# Patient Record
Sex: Female | Born: 1935 | Race: White | Hispanic: No | Marital: Married | Smoking: Current some day smoker
Health system: Southern US, Community
[De-identification: ages and names within clinical notes are randomized; demographics above are authoritative.]

## PROBLEM LIST (undated history)

## (undated) DIAGNOSIS — I10 Essential (primary) hypertension: Secondary | ICD-10-CM

## (undated) DIAGNOSIS — E785 Hyperlipidemia, unspecified: Secondary | ICD-10-CM

## (undated) DIAGNOSIS — C801 Malignant (primary) neoplasm, unspecified: Secondary | ICD-10-CM

## (undated) DIAGNOSIS — I716 Thoracoabdominal aortic aneurysm, without rupture, unspecified: Secondary | ICD-10-CM

## (undated) DIAGNOSIS — I209 Angina pectoris, unspecified: Secondary | ICD-10-CM

## (undated) DIAGNOSIS — J449 Chronic obstructive pulmonary disease, unspecified: Secondary | ICD-10-CM

## (undated) DIAGNOSIS — S7290XA Unspecified fracture of unspecified femur, initial encounter for closed fracture: Secondary | ICD-10-CM

## (undated) DIAGNOSIS — I6529 Occlusion and stenosis of unspecified carotid artery: Secondary | ICD-10-CM

## (undated) HISTORY — PX: FOOT FRACTURE SURGERY: SHX645

## (undated) HISTORY — PX: CAROTID ENDARTERECTOMY: SUR193

## (undated) HISTORY — PX: TONSILLECTOMY: SUR1361

## (undated) HISTORY — PX: CATARACT EXTRACTION: SUR2

---

## 2014-02-01 DIAGNOSIS — S7290XA Unspecified fracture of unspecified femur, initial encounter for closed fracture: Secondary | ICD-10-CM

## 2014-02-01 HISTORY — DX: Unspecified fracture of unspecified femur, initial encounter for closed fracture: S72.90XA

## 2014-02-26 ENCOUNTER — Inpatient Hospital Stay (HOSPITAL_COMMUNITY): Payer: Medicare Other

## 2014-02-26 ENCOUNTER — Encounter (HOSPITAL_COMMUNITY): Payer: Self-pay | Admitting: General Practice

## 2014-02-26 ENCOUNTER — Inpatient Hospital Stay (HOSPITAL_COMMUNITY)
Admission: AD | Admit: 2014-02-26 | Discharge: 2014-03-03 | DRG: 536 | Disposition: A | Discharge status: Skilled Nursing Facility | Payer: Medicare Other | Source: Other Acute Inpatient Hospital | Attending: Internal Medicine | Admitting: Internal Medicine

## 2014-02-26 DIAGNOSIS — Z7401 Bed confinement status: Secondary | ICD-10-CM

## 2014-02-26 DIAGNOSIS — E039 Hypothyroidism, unspecified: Secondary | ICD-10-CM | POA: Diagnosis present

## 2014-02-26 DIAGNOSIS — I5032 Chronic diastolic (congestive) heart failure: Secondary | ICD-10-CM | POA: Diagnosis present

## 2014-02-26 DIAGNOSIS — F0390 Unspecified dementia without behavioral disturbance: Secondary | ICD-10-CM | POA: Diagnosis present

## 2014-02-26 DIAGNOSIS — E785 Hyperlipidemia, unspecified: Secondary | ICD-10-CM | POA: Diagnosis present

## 2014-02-26 DIAGNOSIS — I10 Essential (primary) hypertension: Secondary | ICD-10-CM | POA: Diagnosis present

## 2014-02-26 DIAGNOSIS — S72009A Fracture of unspecified part of neck of unspecified femur, initial encounter for closed fracture: Secondary | ICD-10-CM

## 2014-02-26 DIAGNOSIS — I714 Abdominal aortic aneurysm, without rupture, unspecified: Secondary | ICD-10-CM

## 2014-02-26 DIAGNOSIS — I509 Heart failure, unspecified: Secondary | ICD-10-CM | POA: Diagnosis present

## 2014-02-26 DIAGNOSIS — J479 Bronchiectasis, uncomplicated: Secondary | ICD-10-CM | POA: Diagnosis present

## 2014-02-26 DIAGNOSIS — S72033A Displaced midcervical fracture of unspecified femur, initial encounter for closed fracture: Principal | ICD-10-CM | POA: Diagnosis present

## 2014-02-26 DIAGNOSIS — M79609 Pain in unspecified limb: Secondary | ICD-10-CM

## 2014-02-26 DIAGNOSIS — F17200 Nicotine dependence, unspecified, uncomplicated: Secondary | ICD-10-CM | POA: Diagnosis present

## 2014-02-26 DIAGNOSIS — I25810 Atherosclerosis of coronary artery bypass graft(s) without angina pectoris: Secondary | ICD-10-CM | POA: Diagnosis present

## 2014-02-26 DIAGNOSIS — I6529 Occlusion and stenosis of unspecified carotid artery: Secondary | ICD-10-CM | POA: Diagnosis present

## 2014-02-26 DIAGNOSIS — Y92009 Unspecified place in unspecified non-institutional (private) residence as the place of occurrence of the external cause: Secondary | ICD-10-CM

## 2014-02-26 DIAGNOSIS — Z85820 Personal history of malignant melanoma of skin: Secondary | ICD-10-CM

## 2014-02-26 DIAGNOSIS — S72001A Fracture of unspecified part of neck of right femur, initial encounter for closed fracture: Secondary | ICD-10-CM | POA: Diagnosis present

## 2014-02-26 DIAGNOSIS — I209 Angina pectoris, unspecified: Secondary | ICD-10-CM | POA: Diagnosis present

## 2014-02-26 DIAGNOSIS — J438 Other emphysema: Secondary | ICD-10-CM | POA: Diagnosis present

## 2014-02-26 DIAGNOSIS — W19XXXA Unspecified fall, initial encounter: Secondary | ICD-10-CM | POA: Diagnosis present

## 2014-02-26 HISTORY — DX: Angina pectoris, unspecified: I20.9

## 2014-02-26 HISTORY — DX: Hyperlipidemia, unspecified: E78.5

## 2014-02-26 HISTORY — DX: Thoracoabdominal aortic aneurysm, without rupture, unspecified: I71.60

## 2014-02-26 HISTORY — DX: Malignant (primary) neoplasm, unspecified: C80.1

## 2014-02-26 HISTORY — DX: Chronic obstructive pulmonary disease, unspecified: J44.9

## 2014-02-26 HISTORY — DX: Unspecified fracture of unspecified femur, initial encounter for closed fracture: S72.90XA

## 2014-02-26 HISTORY — DX: Essential (primary) hypertension: I10

## 2014-02-26 HISTORY — DX: Occlusion and stenosis of unspecified carotid artery: I65.29

## 2014-02-26 HISTORY — DX: Thoracoabdominal aortic aneurysm, without rupture: I71.6

## 2014-02-26 LAB — URINALYSIS, ROUTINE W REFLEX MICROSCOPIC
BILIRUBIN URINE: NEGATIVE
GLUCOSE, UA: NEGATIVE mg/dL
KETONES UR: NEGATIVE mg/dL
Leukocytes, UA: NEGATIVE
Nitrite: NEGATIVE
Protein, ur: NEGATIVE mg/dL
Specific Gravity, Urine: 1.021 (ref 1.005–1.030)
Urobilinogen, UA: 1 mg/dL (ref 0.0–1.0)
pH: 7 (ref 5.0–8.0)

## 2014-02-26 LAB — COMPREHENSIVE METABOLIC PANEL
ALBUMIN: 2.9 g/dL — AB (ref 3.5–5.2)
ALK PHOS: 82 U/L (ref 39–117)
ALT: 34 U/L (ref 0–35)
AST: 33 U/L (ref 0–37)
BILIRUBIN TOTAL: 0.3 mg/dL (ref 0.3–1.2)
BUN: 17 mg/dL (ref 6–23)
CHLORIDE: 99 meq/L (ref 96–112)
CO2: 30 mEq/L (ref 19–32)
Calcium: 9.2 mg/dL (ref 8.4–10.5)
Creatinine, Ser: 0.8 mg/dL (ref 0.50–1.10)
GFR calc Af Amer: 76 mL/min — ABNORMAL LOW (ref >90)
GFR calc non Af Amer: 65 mL/min — ABNORMAL LOW (ref >90)
GLUCOSE: 131 mg/dL — AB (ref 70–99)
POTASSIUM: 4.2 meq/L (ref 3.7–5.3)
SODIUM: 140 meq/L (ref 137–147)
Total Protein: 6.5 g/dL (ref 6.0–8.3)

## 2014-02-26 LAB — LIPID PANEL
CHOLESTEROL: 148 mg/dL (ref 0–200)
HDL: 62 mg/dL (ref >39)
LDL Cholesterol: 74 mg/dL (ref 0–99)
Total CHOL/HDL Ratio: 2.4 RATIO
Triglycerides: 60 mg/dL (ref <150)
VLDL: 12 mg/dL (ref 0–40)

## 2014-02-26 LAB — CBC WITH DIFFERENTIAL/PLATELET
BASOS ABS: 0.1 10*3/uL (ref 0.0–0.1)
Basophils Relative: 1 % (ref 0–1)
EOS PCT: 4 % (ref 0–5)
Eosinophils Absolute: 0.3 10*3/uL (ref 0.0–0.7)
HEMATOCRIT: 40.9 % (ref 36.0–46.0)
Hemoglobin: 13.7 g/dL (ref 12.0–15.0)
Lymphocytes Relative: 17 % (ref 12–46)
Lymphs Abs: 1.5 10*3/uL (ref 0.7–4.0)
MCH: 30.3 pg (ref 26.0–34.0)
MCHC: 33.5 g/dL (ref 30.0–36.0)
MCV: 90.5 fL (ref 78.0–100.0)
Monocytes Absolute: 0.9 10*3/uL (ref 0.1–1.0)
Monocytes Relative: 11 % (ref 3–12)
NEUTROS ABS: 5.8 10*3/uL (ref 1.7–7.7)
Neutrophils Relative %: 68 % (ref 43–77)
Platelets: 314 10*3/uL (ref 150–400)
RBC: 4.52 MIL/uL (ref 3.87–5.11)
RDW: 15 % (ref 11.5–15.5)
WBC: 8.5 10*3/uL (ref 4.0–10.5)

## 2014-02-26 LAB — URINE MICROSCOPIC-ADD ON

## 2014-02-26 LAB — MAGNESIUM: MAGNESIUM: 2.2 mg/dL (ref 1.5–2.5)

## 2014-02-26 LAB — TROPONIN I: Troponin I: <0.3 ng/mL (ref <0.30)

## 2014-02-26 MED ORDER — ASPIRIN EC 81 MG PO TBEC
81.0000 mg | DELAYED_RELEASE_TABLET | Freq: Every day | ORAL | Status: DC
  Administered 2014-02-26 – 2014-03-03 (×6): 81 mg via ORAL
  Filled 2014-02-26 (×6): qty 1

## 2014-02-26 MED ORDER — DILTIAZEM HCL ER COATED BEADS 240 MG PO CP24
240.0000 mg | ORAL_CAPSULE | Freq: Every day | ORAL | Status: DC
  Administered 2014-02-27 – 2014-03-03 (×5): 240 mg via ORAL
  Filled 2014-02-26 (×5): qty 1

## 2014-02-26 MED ORDER — LEVALBUTEROL HCL 1.25 MG/0.5ML IN NEBU
1.2500 mg | INHALATION_SOLUTION | Freq: Four times a day (QID) | RESPIRATORY_TRACT | Status: DC | PRN
  Filled 2014-02-26: qty 0.5

## 2014-02-26 MED ORDER — ATORVASTATIN CALCIUM 10 MG PO TABS
10.0000 mg | ORAL_TABLET | Freq: Every day | ORAL | Status: DC
  Administered 2014-02-27 – 2014-03-02 (×4): 10 mg via ORAL
  Filled 2014-02-26 (×5): qty 1

## 2014-02-26 MED ORDER — TIOTROPIUM BROMIDE MONOHYDRATE 18 MCG IN CAPS
18.0000 ug | ORAL_CAPSULE | Freq: Every day | RESPIRATORY_TRACT | Status: DC
  Administered 2014-02-28 – 2014-03-03 (×4): 18 ug via RESPIRATORY_TRACT
  Filled 2014-02-26: qty 5

## 2014-02-26 MED ORDER — SENNA 8.6 MG PO TABS
1.0000 | ORAL_TABLET | Freq: Two times a day (BID) | ORAL | Status: DC
  Administered 2014-02-26 – 2014-03-03 (×4): 8.6 mg via ORAL
  Filled 2014-02-26 (×11): qty 1

## 2014-02-26 MED ORDER — NICOTINE 21 MG/24HR TD PT24
21.0000 mg | MEDICATED_PATCH | Freq: Every day | TRANSDERMAL | Status: DC
  Administered 2014-02-26: 21 mg via TRANSDERMAL
  Filled 2014-02-26 (×2): qty 1

## 2014-02-26 MED ORDER — CEFAZOLIN SODIUM 1-5 GM-% IV SOLN
1.0000 g | Freq: Once | INTRAVENOUS | Status: AC
  Administered 2014-02-27: 1 g via INTRAVENOUS
  Filled 2014-02-26: qty 50

## 2014-02-26 MED ORDER — OXYCODONE HCL 5 MG PO TABS
5.0000 mg | ORAL_TABLET | ORAL | Status: DC | PRN
  Administered 2014-02-28 – 2014-03-03 (×9): 5 mg via ORAL
  Filled 2014-02-26 (×9): qty 1

## 2014-02-26 MED ORDER — ACETAMINOPHEN 325 MG PO TABS
650.0000 mg | ORAL_TABLET | Freq: Four times a day (QID) | ORAL | Status: DC | PRN
  Administered 2014-02-28 – 2014-03-01 (×2): 650 mg via ORAL
  Filled 2014-02-26 (×4): qty 2

## 2014-02-26 MED ORDER — ACETAMINOPHEN 650 MG RE SUPP: 650.0000 mg | Freq: Four times a day (QID) | RECTAL | Status: DC | PRN

## 2014-02-26 MED ORDER — ENOXAPARIN SODIUM 40 MG/0.4ML ~~LOC~~ SOLN
40.0000 mg | SUBCUTANEOUS | Status: DC
  Administered 2014-02-26 – 2014-03-02 (×5): 40 mg via SUBCUTANEOUS
  Filled 2014-02-26 (×6): qty 0.4

## 2014-02-26 MED ORDER — EZETIMIBE 10 MG PO TABS
5.0000 mg | ORAL_TABLET | Freq: Every day | ORAL | Status: DC
  Administered 2014-02-27 – 2014-03-03 (×5): 5 mg via ORAL
  Filled 2014-02-26 (×5): qty 0.5

## 2014-02-26 MED ORDER — SODIUM CHLORIDE 0.9 % IV SOLN
INTRAVENOUS | Status: DC
  Administered 2014-02-26: 21:00:00 via INTRAVENOUS

## 2014-02-26 MED ORDER — DOCUSATE SODIUM 100 MG PO CAPS
100.0000 mg | ORAL_CAPSULE | Freq: Two times a day (BID) | ORAL | Status: DC
  Administered 2014-02-26 – 2014-03-03 (×5): 100 mg via ORAL
  Filled 2014-02-26 (×11): qty 1

## 2014-02-26 NOTE — Progress Notes (Signed)
Patient has arrived via transport from [place] to 2w20. VS stable, patient assessed.  Attending MD made aware.
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
## 2014-02-26 NOTE — Progress Notes (Signed)
Patient arrived from hospital in [place] with 16 FR foley. Insertion date unknown at this time. No insertion date listed on bad.  Will investigate in paper chart. Elmarie Shiley R
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
## 2014-02-26 NOTE — H&P (Addendum)
[facility] History and Physical  Amy Bender TIW:[id number] DOB: Jul 16, 1926 DOA: 02/26/2014  Referring physician:  PCP: PROVIDER NOT IN SYSTEM  Specialists:   Chief Complaint:   HPI: Amy Bender  63 y WF  PMHx diastolic CHF, dementia, AAA 6.8-6.9 cm by CT at [facility] 09/18/2013, angina, HLD, HTN, CAD, COPD (diffuse emphysema/bronchiectasis) diagnosed by CT scan 09/18/2013. Perforation approximately one month ago slipped while trying to open a refrigerator door fell to the floor onto her right side. States for approximately the last month has been between home and [facility] in [place]. Patient was also seen by Dr. Corky Sox at [facility] (vascular surgery) and appears offered patient surgical repair of AAA however within notes states that family decided against repair (patient states has no family).Patient states fell approximately one month ago patient presents with right femoral neck fracture which occurred approximately a month ago per patient. Dr. Bridgett Larsson (vascular surgery)is evaluating patient secondary to her large AAA, PA Lanny Hurst Paul/Dr. Marcelino Scot (orthopedic surgery)is evaluating patient for surgical repair vs conservative management.patient currently complains of no pain, although states really need to cigarette.     Review of Systems: The patient denies anorexia, fever, weight loss,, vision loss, decreased hearing, hoarseness, syncope, peripheral edema, hemoptysis, abdominal pain, melena, hematochezia, severe indigestion/heartburn, hematuria, incontinence, genital sores, muscle weakness, suspicious skin lesions, transient blindness, depression, unusual weight change, abnormal bleeding, enlarged lymph nodes, angioedema, and breast masses.    TRAVEL HISTORY:   Procedure Echocardiogram at [facility] 02/16/2014 -Moderate LVH -LVEF= 25% -Grade 1 diastolic dysfunction -Borderline pulmonary hypertension   Antibiotics   Consultation Dr. Bridgett Larsson  (vascular surgery) PA Lanny Hurst Paul(orthopedic surgery)    Past Medical History  Diagnosis Date  . Anginal pain    No past surgical history on file. Social History:  1 PPD x60 years, positive ETOHglass of wine nightly, negative drugs where does patient live--home, ALF, SNF? Home with 2 borders  Can patient participate in ADLs?  No Known Allergies  No family history on file.   Prior to Admission medications   Not on File   Physical Exam: Filed Vitals:   02/26/14 1741  BP: 105/81  Pulse: 76  Temp: 98.3 F (36.8 C)  TempSrc: Oral  Resp: 20  Height: 5\' 6"  (1.676 m)  Weight: 57.657 kg (127 lb 1.8 oz)  SpO2: 97%     General:  A./O. x3 (unsure of when), patient tends to wander off subject, NAD  Eyes: pupils equal reactive to light and accommodation  Neck: negative JVD, negative lymphadenopathy  Cardiovascular: regular rate, negative murmurs rubs gallops  Respiratory: clear to auscultation bilateral  Abdomen: soft, nontender, nondistended, plus bowel sounds  Musculoskeletal: right leg sits in anatomically correct position,DP/PT pulse bilateral lower extremities 1+  Neurologic: cranial nerve II through XII intact, patient pleasantly mildly demented, follows all commands moves all extremities to command, sensation intact throughout  Labs on Admission:  Basic Metabolic Panel: No results found for this basename: NA, K, CL, CO2, GLUCOSE, BUN, CREATININE, CALCIUM, MG, PHOS,  in the last 168 hours Liver Function Tests: No results found for this basename: AST, ALT, ALKPHOS, BILITOT, PROT, ALBUMIN,  in the last 168 hours No results found for this basename: LIPASE, AMYLASE,  in the last 168 hours No results found for this basename: AMMONIA,  in the last 168 hours CBC: No results found for this basename: WBC, NEUTROABS, HGB, HCT, MCV, PLT,  in the last 168 hours Cardiac Enzymes: No results found  for this basename: CKTOTAL, CKMB, CKMBINDEX, TROPONINI,  in the last 168  hours  BNP (last 3 results) No results found for this basename: PROBNP,  in the last 8760 hours CBG: No results found for this basename: GLUCAP,  in the last 168 hours  Radiological Exams on Admission: No results found.  EKG: pending  Assessment/Plan Principal Problem:   Fracture of neck of right femur Active Problems:   AAA (abdominal aortic aneurysm) without rupture   HLD (hyperlipidemia)   HTN (hypertension)   CAD (coronary artery disease) of artery bypass graft   Other emphysema   Bronchiectasis without acute exacerbation   Anginal pain   Diastolic CHF, chronic    Right femur neck fracture  -PA Lanny Hurst Paul/Dr. Marcelino Scot (orthopedic surgery)will determine if fracture repairable after obtaining new x-ray studies.  AAA -Dr. Bridgett Larsson (vascular surgery)will obtain new CT angiogram to determine if AAA has enlarged, is repairable, or it is safe for orthopedic surgery to proceed if required.  Diastolic CHF -Diltiazem ER 240 mg daily -obtain EKG -Obtain troponin x3 -Obtain proBNP -Will consider updated echocardiogram dependent on findings  Anginal pain -See diastolic CHF -HEART Score; ? But will be very high  HLD -Lipitor 10 mg daily -Obtain lipid panel-  COPD -Respiratory to titrate O2 to maintain SpO2> 90% -Xopenex q 6 hour PRN SOB -Spiriva daily  Bronchiectasis -See COPD  Nicotine dependence -Start patient on nicotine patch       Code Status: full Family Communication: healthcare power of attorney is Amy Bender  [phone] See notarized documented in chart. This polk did not accompany patient to admission Disposition Plan: per orthopedic surgery  Time spent: 90 minutes  Allie Bossier [facility] Pager [phone]  If 7PM-7AM, please contact night-coverage [email or web address] Password [facility] 02/26/2014, 7:47 PM

## 2014-02-27 ENCOUNTER — Encounter (HOSPITAL_COMMUNITY): Payer: Self-pay | Admitting: Radiology

## 2014-02-27 ENCOUNTER — Inpatient Hospital Stay (HOSPITAL_COMMUNITY): Payer: Medicare Other

## 2014-02-27 DIAGNOSIS — M79609 Pain in unspecified limb: Secondary | ICD-10-CM

## 2014-02-27 LAB — TROPONIN I
Troponin I: <0.3 ng/mL (ref <0.30)
Troponin I: <0.3 ng/mL (ref <0.30)

## 2014-02-27 LAB — CBC
HEMATOCRIT: 41 % (ref 36.0–46.0)
HEMOGLOBIN: 13.8 g/dL (ref 12.0–15.0)
MCH: 30.4 pg (ref 26.0–34.0)
MCHC: 33.7 g/dL (ref 30.0–36.0)
MCV: 90.3 fL (ref 78.0–100.0)
Platelets: 319 10*3/uL (ref 150–400)
RBC: 4.54 MIL/uL (ref 3.87–5.11)
RDW: 15 % (ref 11.5–15.5)
WBC: 9 10*3/uL (ref 4.0–10.5)

## 2014-02-27 LAB — TSH: TSH: 5.019 u[IU]/mL — ABNORMAL HIGH (ref 0.350–4.500)

## 2014-02-27 LAB — HEMOGLOBIN A1C
Hgb A1c MFr Bld: 5.6 % (ref <5.7)
MEAN PLASMA GLUCOSE: 114 mg/dL (ref <117)

## 2014-02-27 MED ORDER — IOHEXOL 350 MG/ML SOLN
100.0000 mL | Freq: Once | INTRAVENOUS | Status: AC | PRN
  Administered 2014-02-27: 100 mL via INTRAVENOUS

## 2014-02-27 NOTE — Progress Notes (Signed)
Orthopaedic Trauma Service Progress Note  Subjective  Doing well this am Pain controlled Sitting up in bed Cousin visiting   Objective   BP 119/74  Pulse 98  Temp(Src) 98.4 F (36.9 C) (Oral)  Resp 18  Ht 5\' 6"  (1.676 m)  Wt 57.657 kg (127 lb 1.8 oz)  BMI 20.53 kg/m2  SpO2 99%  Intake/Output     03/26 0701 - 03/27 0700 03/27 0701 - 03/28 0700   I.V. (mL/kg)  700 (12.1)   Total Intake(mL/kg)  700 (12.1)   Urine (mL/kg/hr)  1000 (3.6)   Total Output   1000   Net   -300           Exam   Gen: awake and alert, appears comfortable  Ext:        Right Lower Extremity   No interval change from exam yesterday evening    Imaging  CT abd/pelvis, Xray R hip   After review of CT and xray from a bone standpoint the R femoral neck fracture appears to be stable and shows some evidence of healing   Assessment and Plan   POD/HD#: 1 (possibly 4 weeks post injury, at least 2)   80 y/o female s/p fall approximately 1 month ago with R femoral neck fracture and AAA, as well as numerous other medical comorbities   1. Subacute R subcapital valgus impacted R femoral neck fracture             stable on follow up films  Will manage non-op for now and make pt/fracture prove that she needs surgery  Certainly vascular issues could decrease healing capacity as well, certainly nicotine use will do this as well  PT/OT evals  TDWB with walker  Would like PT to work with pt sat and sun   2. AAA             Per vascular               Please see note by Dr. Bridgett Larsson for for evaluation and recommendations  3. Medical issues             Per primary team  4. FEN             advance as tolerated   5. DVT/PE prophylaxis               SCD  Ok for pharmacologics from our standpoint  6. UTI        U/A was negative on admission labs  Adequately treated at other facility   7. Pain               Tylenol and low dose narcotics as needed  8. Nicotine dependence  No nicotine patch  This a  continuous source of nicotine with increases the pts risk of healing complication by approximately 40%   9. Dispo             non-op             Will need snf at dc               has supportive extended family as one cousin lives in [place] and one lives in [place] situation sound questionable, 2 males live in her house and are partners, doesn't sound like they were necessarily invited to live there but moved in [place]. ? If this needs to be explored to protect pt.    [name], PA-C [facility] [phone] (P) 02/27/2014 11:52 AM

## 2014-02-27 NOTE — Progress Notes (Addendum)
   Daily Progress Note  Assessment/Planning: AAA, R femoral neck fracture   Briefly on my review of this patient's CTA, it appears her AAA is marginally larger with a orthogonal diameter slightly >7.0 cm.  Her anatomy is not favorable due to extensive calcification of the distal aorta and iliac arteries.  The right common iliac artery appears occluded and the common femoral arteries appears severely calcified.    Her anatomic issues prevent her from getting an endograft repair.  These same issues would lengthen an open repair.  With her age and co-morbidities, she is at increased risk for operative mortality, likely >10%.  I have discussed briefly the options with this patient: operative repair with Dr. Sammuel Hines vs. expectant management.  At this point, she remains asx so no intervention is immediately needed.  If pt elects to proceed with repair, I would transfer her to [facility].  I have discussed this case with one of my partners, Dr. Oneida Alar, who agrees this patient is best served in Dr. Eddie Dibbles care.  Subjective   Various complaints  Objective Filed Vitals:   02/26/14 1741 02/26/14 2023 02/27/14 0544  BP: 105/81 120/93 119/74  Pulse: 76 75 98  Temp: 98.3 F (36.8 C) 98.6 F (37 C) 98.4 F (36.9 C)  TempSrc: Oral Oral Oral  Resp: 20 17 18   Height: 5\' 6"  (1.676 m)    Weight: 127 lb 1.8 oz (57.657 kg)    SpO2: 97% 95% 99%   No intake or output data in the 24 hours ending 02/27/14 0730  PULM  CTAB CV  RRR GI  soft, NTND VASC  No palpable pedal pulses, palpable pulsatile mass in abdomen  Laboratory CBC    Component Value Date/Time   WBC 9.0 02/27/2014 0130   HGB 13.8 02/27/2014 0130   HCT 41.0 02/27/2014 0130   PLT 319 02/27/2014 0130    BMET    Component Value Date/Time   NA 140 02/26/2014 2218   K 4.2 02/26/2014 2218   CL 99 02/26/2014 2218   CO2 30 02/26/2014 2218   GLUCOSE 131* 02/26/2014 2218   BUN 17 02/26/2014 2218   CREATININE 0.80 02/26/2014 2218   CALCIUM 9.2 02/26/2014 2218   GFRNONAA 65* 02/26/2014 2218   GFRAA 76* 02/26/2014 2218    Adele Barthel, MD [facility] [place]: [phone] Pager: [phone]  02/27/2014, 7:30 AM

## 2014-02-27 NOTE — Progress Notes (Addendum)
[facility] PROGRESS NOTE  Amy Bender HDQ:[id number] DOB: 1926/04/29 DOA: 02/26/2014 PCP: PASI, MOHIT, MD  Brief narrative: 80 year-old female with past medical history of diastolic CHF, dementia, AAA 6.8 - 6.9 cm on CT 09/18/2013, dyslipidemia, hypertension, CAD, COPD with diffuse emphysema who presented to [facility] ED 02/26/2014 status post fall 4 weeks prior to this admission. Right hip x-ray on admission showed right femoral neck fracture. Also seen was a large abdominal aortic aneurysm, 9.3 cm. This was confirmed on CT angio abdomen, diameter of the aneurysm was however 8 cm.  Assessment and plan:  Principal Problem:   Fracture of neck of right femur - Subacute R subcapital valgus impacted R femoral neck fracture  - Stable and per orthopedic surgery it will be managed nonoperatively - Will followup with physical therapy, patient to have physical therapy over the weekend. - Pain management with oxycodone 5 mg every 4 hours as needed Active Problems:   AAA (abdominal aortic aneurysm) without rupture - per vascular surgery patient has anatomic issues that prevent her from getting an endograft repair which would lengthen an open repair. Considering  her age and co-morbidities, she is at increased risk for operative mortality, likely >10%. Per vascular, pt remains asymptomatic so no intervention is immediately needed   HLD (hyperlipidemia) - continue statin therapy    HTN (hypertension) - continue diltiazem 240 mg daily    CAD (coronary artery disease) of artery bypass graft - stable; continue aspirin adn statin therapy    COPD - continue spiriva - stable    Constipation - Continue Colace and senna  Consultants:  Orthopedic surgery  Vascular surgery   Procedures/Studies: Dg Hip Complete Right  02/27/2014   IMPRESSION: 1. Right femoral neck angulated fracture 2. Large abdominal aortic aneurysm measuring up to 9.3 cm. CTA of the abdomen and pelvis suggested for further  evaluation.  Dg Knee Right  02/27/2014    IMPRESSION: 1. No acute bony or joint abnormality.  Degenerative change present. 2. Peripheral vascular disease.    Ct Angio Abd/pel W/ And/or W/o  02/27/2014    IMPRESSION: Large abdominal aortic aneurysm with a maximal diameter of 8.0 cm.  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 SMA is patent. IMA origin is occluded.  Right common iliac artery occlusion. Left common iliac artery is patent. There is severe disease in both external iliac arteries.  Extensive stool burden throughout the colon with fecal impaction in the rectum. Mixed density in the presacral space likely represents an inflammatory process associated with fecal impaction.  L4 compression deformity as described.   Antibiotics:  None   Code Status: Full Family Communication: Pt at bedside Disposition Plan: Home when medically stable  HPI/Subjective: No events overnight.   Objective: Filed Vitals:   02/26/14 1741 02/26/14 2023 02/27/14 0544  BP: 105/81 120/93 119/74  Pulse: 76 75 98  Temp: 98.3 F (36.8 C) 98.6 F (37 C) 98.4 F (36.9 C)  TempSrc: Oral Oral Oral  Resp: 20 17 18   Height: 5\' 6"  (1.676 m)    Weight: 57.657 kg (127 lb 1.8 oz)    SpO2: 97% 95% 99%    Intake/Output Summary (Last 24 hours) at 02/27/14 1227 Last data filed at 02/27/14 1135  Gross per 24 hour  Intake    700 ml  Output   1000 ml  Net   -300 ml    Exam:   General:  Pt is alert, follows commands appropriately, not in acute distress  Cardiovascular:  Regular rate and rhythm, S1/S2, no murmurs, no rubs, no gallops  Respiratory: Clear to auscultation bilaterally, no wheezing, diminished breath sounds at bases   Abdomen: Soft, non tender, bowel sounds present, no guarding  Extremities: No edema, pulses DP and PT palpable bilaterally; right hip pain  Data Reviewed: Basic Metabolic Panel:  Recent Labs Lab 02/26/14 2218  NA 140  K 4.2  CL 99  CO2 30  GLUCOSE 131*  BUN 17   CREATININE 0.80  CALCIUM 9.2  MG 2.2   Liver Function Tests:  Recent Labs Lab 02/26/14 2218  AST 33  ALT 34  ALKPHOS 82  BILITOT 0.3  PROT 6.5  ALBUMIN 2.9*   No results found for this basename: LIPASE, AMYLASE,  in the last 168 hours No results found for this basename: AMMONIA,  in the last 168 hours CBC:  Recent Labs Lab 02/26/14 2218 02/27/14 0130  WBC 8.5 9.0  NEUTROABS 5.8  --   HGB 13.7 13.8  HCT 40.9 41.0  MCV 90.5 90.3  PLT 314 319   Cardiac Enzymes:  Recent Labs Lab 02/26/14 2218 02/27/14 0130 02/27/14 0740  TROPONINI <0.30 <0.30 <0.30    Scheduled Meds: . aspirin EC  81 mg Oral Daily  . atorvastatin  10 mg Oral q1800  . diltiazem  240 mg Oral Daily  . docusate sodium  100 mg Oral BID  . enoxaparin (LOVENOX)   40 mg Subcutaneous Q24H  . ezetimibe  5 mg Oral Daily  . senna  1 tablet Oral BID  . tiotropium  18 mcg Inhalation Daily     Faye Ramsay, MD  [facility] Pager [phone]  If 7PM-7AM, please contact night-coverage [email or web address] Password [facility] 02/27/2014, 12:27 PM   LOS: 1 day

## 2014-02-27 NOTE — Progress Notes (Signed)
VASCULAR LAB PRELIMINARY  ARTERIAL  ABI completed:    RIGHT    LEFT    PRESSURE WAVEFORM  PRESSURE WAVEFORM  BRACHIAL 130 triphasic BRACHIAL 129 triphasic  DP 48 Severely dampened monophasic DP 44 Severely dampened monophasic  AT   AT    PT 63 Dampened monophasic PT 56 Dampened monophasic  PER   PER    GREAT TOE  NA GREAT TOE  NA    RIGHT LEFT  ABI 0.48 0.43     Levon Boettcher, RVT 02/27/2014, 2:30 PM

## 2014-02-27 NOTE — Consult Note (Signed)
I have seen and examined the patient. I agree with the findings above.  Examination reveals no swelling and ease of motion when lying in bed. Remainder as above.   F/u CT scan and plain films display evidence of maintained reduction with some progress toward bone healing. I have discussed at length with the patient and her cousin the risks and benefits of surgical versus nonsurgical treatment, and they elect to continue with nonsurgical.  We will have PT attempt to mobilize the patient with TDWB on RLE today and over the weekend, then reevaluate.  If she progresses as anticipated then SNF placement would be expected early next week.  Should the fracture go on to symptomatic nonunion, malunion, or collapse then we will revisit surgical options, but in the absence of that we are in agreement with nonoperative course given major comorbidities, particularly her 8x8cm AAA.  Altamese [place], MD [facility] [phone] (p)  11:51 AM

## 2014-02-27 NOTE — Progress Notes (Signed)
I saw and examined the patient with Mr. Paul, communicating the findings and plan noted above.  Detavious Rinn, MD [facility] [phone] (p)  
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
## 2014-02-27 NOTE — Evaluation (Signed)
Physical Therapy Evaluation Patient Details Name: KEGAN SHEPARDSON MRN: [id number] DOB: January 20, 1926 Today's Date: 02/27/2014   History of Present Illness  LILLYAN HITSON  62 y WF  PMHx diastolic CHF, dementia, AAA 6.8-6.9 cm by CT at [facility] 09/18/2013, angina, HLD, HTN, CAD, COPD (diffuse emphysema/bronchiectasis) diagnosed by CT scan 09/18/2013. Perforation approximately one month ago slipped while trying to open a refrigerator door fell to the floor onto her right side. States for approximately the last month has been between home and [facility] in [place]. Patient was also seen by Dr. Corky Sox at [facility] (vascular surgery) and appears offered patient surgical repair of AAA however within notes states that family decided against repair (patient states has no family).Patient states fell approximately one month ago patient presents with right femoral neck fracture which occurred approximately a month ago per patient. Dr. Bridgett Larsson (vascular surgery)is evaluating patient secondary to her large AAA,  Clinical Impression  Pt admitted with pain and femoral neck fx and AAA.  At this time pt and family want conservative management.  Pt currently limited functionally due to the problems listed. ( See problems list.)   Pt will benefit from PT to maximize function and safety in order to get ready for next venue listed below.     Follow Up Recommendations SNF;Supervision/Assistance - 24 hour    Equipment Recommendations  Rolling walker with 5" wheels;3in1 (PT)    Recommendations for Other Services       Precautions / Restrictions Precautions Precautions: Fall Restrictions Weight Bearing Restrictions: Yes RLE Weight Bearing: Touchdown weight bearing      Mobility  Bed Mobility Overal bed mobility: Needs Assistance Bed Mobility: Supine to Sit;Sit to Supine     Supine to sit: Mod assist;+2 for physical assistance Sit to supine: Mod assist;+2 for physical assistance    General bed mobility comments: cues for sequencing,  bridging assist and truncal assit up to EOB  Transfers Overall transfer level: Needs assistance Equipment used: Rolling walker (2 wheeled) Transfers: Sit to/from Stand Sit to Stand: Mod assist;+2 physical assistance         General transfer comment: cues for safety, hand placement and coming forward/lifting assist  Ambulation/Gait                Stairs            Wheelchair Mobility    Modified Rankin (Stroke Patients Only)       Balance Overall balance assessment: Needs assistance Sitting-balance support: No upper extremity supported;Single extremity supported;Feet supported Sitting balance-Leahy Scale: Fair     Standing balance support: Bilateral upper extremity supported;During functional activity Standing balance-Leahy Scale: Poor                       Pertinent Vitals/Pain Not rated, but painful with movement    Home Living Family/patient expects to be discharged to:: Private residence (but will need to go to SNF for rehab) Living Arrangements:  (2 live in residents that help her do what she can't) Available Help at Discharge: Other (Comment) (no help she can count on 24/7) Type of Home: House Home Access: Stairs to enter Entrance Stairs-Rails: Psychiatric nurse of Steps: 18 Home Layout: Multi-level Home Equipment: Tub bench;Walker - standard      Prior Function Level of Independence: Independent               Hand Dominance        Extremity/Trunk Assessment  Upper Extremity Assessment: Generalized weakness;Overall WFL for tasks assessed           Lower Extremity Assessment: LLE deficits/detail;RLE deficits/detail RLE Deficits / Details: generally painful and as a result weak in quads, hip flexors and hams at 3-/5 a=ow >3/5 LLE Deficits / Details: general weakness, but functional     Communication   Communication: No difficulties  Cognition  Arousal/Alertness: Awake/alert Behavior During Therapy: WFL for tasks assessed/performed;Anxious Overall Cognitive Status: Within Functional Limits for tasks assessed                      General Comments      Exercises Total Joint Exercises Ankle Circles/Pumps: AROM;20 reps;Both;Supine Quad Sets: AROM;Both;10 reps;Supine Heel Slides: AAROM;Both;10 reps;Supine Hip ABduction/ADduction: AAROM;Both;AROM;15 reps;Supine      Assessment/Plan    PT Assessment Patient needs continued PT services  PT Diagnosis Generalized weakness;Acute pain;Difficulty walking   PT Problem List Decreased strength;Decreased activity tolerance;Decreased range of motion;Decreased mobility;Decreased knowledge of use of DME;Decreased knowledge of precautions;Pain  PT Treatment Interventions DME instruction;Gait training;Stair training;Functional mobility training;Therapeutic activities;Therapeutic exercise;Patient/family education   PT Goals (Current goals can be found in the Care Plan section) Acute Rehab PT Goals Patient Stated Goal: ultimately get back to my house PT Goal Formulation: With patient Time For Goal Achievement: 03/13/14 Potential to Achieve Goals: Fair    Frequency Min 3X/week   Barriers to discharge Decreased caregiver support;Inaccessible home environment      End of Session   Activity Tolerance: Patient tolerated treatment well;Patient limited by pain;Other (comment) (limited also by need to transport to vascular stucy) Patient left: in bed;with call bell/phone within reach         Time: 1305-1336 PT Time Calculation (min): 31 min   Charges:   PT Evaluation $Initial PT Evaluation Tier I: 1 Procedure PT Treatments $Therapeutic Exercise: 8-22 mins $Therapeutic Activity: 8-22 mins   PT G Codes:          Malkia Nippert, Tessie Fass 02/27/2014, 2:25 PM 02/27/2014  Donnella Sham, PT [phone]  (pager)

## 2014-02-28 NOTE — Clinical Social Work Note (Signed)
CSW received follow-up for d/c planning and met with patient at bedside. Patient presented alert and oriented x4. CSW discussed d/c plan with patient. Patient reported residing in her home with two friends. Patient reported both friends are female and assist her around the home. Patient reported prior to her fall, she could do more around the house, but both men assisted her with the things she could not do. Patient reported both men work as one man Visual merchandiser) does catering and the other Elta [place]) does odd jobs. Patient stated she typically gives one of the guys money and they go out and purchase food and toiletries for the home. Patient was agreeable to SNF in [facility], and also surrounding counties as a back-up. CSW further discussed d/c plan with patient's POA, Amy Bender, per Ms. Amy Bender, Amy Bender is a good friend of patient and her husband, and agreed to continue residing in the home after patient's husband died. Ms. Amy Bender further stated Amy Bender does catering and assists patient tremendously around the home. Ms. Amy Bender reported Amy Bender is appropriate as is patient's other friend, Amy Bender, who resides in the home. Ms. Amy Bender stated Amy Bender lives upstairs, patient on the first floor, and Amy Bender in the basement. Patient's cousin reported both men knew patient's husband and have been residing with patient since husband died. Patient's cousin reported she is POA and makes sure the bills are paid and handles the finances as well as other affairs of patient. Patient's cousin was agreeable with d/c plan and is hopeful patient will recover enough to return home. CSW provided appropriate support and will continue to follow with d/c planning needs.   [place], [place] Weekend Clinical Social Worker [phone]

## 2014-02-28 NOTE — Clinical Social Work Placement (Signed)
Clinical Social Work Department CLINICAL SOCIAL WORK PLACEMENT NOTE 02/28/2014  Patient:  KAILEA, DANNEMILLER  Account Number:  [id number] Admit date:  02/26/2014  Clinical Social Worker:  Carrington Clamp, [place]  Date/time:  02/28/2014 05:43 PM  Clinical Social Work is seeking post-discharge placement for this patient at the following level of care:   [place]   (*CSW will update this form in Epic as items are completed)   02/28/2014  Patient/family provided with [place] Department of Clinical Social Work's list of facilities offering this level of care within the geographic area requested by the patient (or if unable, by the patient's family).  02/28/2014  Patient/family informed of their freedom to choose among providers that offer the needed level of care, that participate in Medicare, Medicaid or managed care program needed by the patient, have an available bed and are willing to accept the patient.  02/28/2014  Patient/family informed of [facility]' ownership interest in [facility], as well as of the fact that they are under no obligation to receive care at this facility.  PASARR submitted to EDS on 02/28/2014 PASARR number received from EDS on 02/28/2014  FL2 transmitted to all facilities in geographic area requested by pt/family on  02/28/2014 FL2 transmitted to all facilities within larger geographic area on 02/28/2014  Patient informed that his/her managed care company has contracts with or will negotiate with  certain facilities, including the following:     Patient/family informed of bed offers received:   Patient chooses bed at  Physician recommends and patient chooses bed at    Patient to be transferred to  on   Patient to be transferred to facility by   The following physician request were entered in Epic:   Additional Comments:  Carrington Clamp, [place] Weekend Clinical Social Worker [phone]

## 2014-02-28 NOTE — Progress Notes (Signed)
No abdominal pain  Filed Vitals:   02/27/14 0544 02/27/14 2055 02/28/14 0444 02/28/14 0959  BP: 119/74 132/79 136/84   Pulse: 98 75 67   Temp: 98.4 F (36.9 C) 98.4 F (36.9 C) 98.5 F (36.9 C)   TempSrc: Oral Oral Oral   Resp: 18 17 17    Height:      Weight:      SpO2: 99% 97% 94% 97%    Known asymptomatic large AAA.  Pt has been evaluated at [facility] for elective repair and should resume her care with Dr Sammuel Hines if she wishes elective repair  Will sign off  Ruta Hinds, MD [facility]: [phone] Pager: [phone]

## 2014-02-28 NOTE — Progress Notes (Signed)
Patient ID: Amy Bender, female   DOB: May 22, 1926, 80 y.o.   MRN: [id number]  [facility] PROGRESS NOTE  AHRIANA GUNKEL QIW:[id number] DOB: 07/21/1926 DOA: 02/26/2014 PCP: PASI, MOHIT, MD  Brief narrative:  80 year-old female with past medical history of diastolic CHF, dementia, AAA 6.8 - 6.9 cm on CT 09/18/2013, dyslipidemia, hypertension, CAD, COPD with diffuse emphysema who presented to [facility] ED 02/26/2014 status post fall 4 weeks prior to this admission. Right hip x-ray on admission showed right femoral neck fracture. Also seen was a large abdominal aortic aneurysm, 9.3 cm. This was confirmed on CT angio abdomen, diameter of the aneurysm was however 8 cm.   Assessment and plan:  Principal Problem:  Fracture of neck of right femur  - Subacute R subcapital valgus impacted R femoral neck fracture  - Stable and per orthopedic surgery it will be managed nonoperatively  - Pain management with oxycodone 5 mg every 4 hours as needed  Active Problems:  AAA (abdominal aortic aneurysm) without rupture  - per vascular surgery patient has anatomic issues that prevent her from getting an endograft repair which would lengthen an open repair. Considering her age and co-morbidities, she is at increased risk for operative mortality, likely >10%.  - Per vascular, pt remains asymptomatic so no intervention is immediately needed  HLD (hyperlipidemia)  - continue statin therapy  HTN (hypertension)  - continue diltiazem 240 mg daily  CAD (coronary artery disease) of artery bypass graft  - stable; continue aspirin adn statin therapy  COPD - continue spiriva  - stable  Constipation  - Continue Colace and senna   Consultants:  Orthopedic surgery  Vascular surgery  Procedures/Studies:  Dg Hip Complete Right 02/27/2014 IMPRESSION: 1. Right femoral neck angulated fracture 2. Large abdominal aortic aneurysm measuring up to 9.3 cm. CTA of the abdomen and pelvis suggested for further evaluation. Dg  Knee Right 02/27/2014 IMPRESSION: 1. No acute bony or joint abnormality. Degenerative change present. 2. Peripheral vascular disease.  Ct Angio Abd/pel W/ And/or W/o 02/27/2014 IMPRESSION: Large abdominal aortic aneurysm with a maximal diameter of 8.0 cm. 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 SMA is patent. IMA origin is occluded. Right common iliac artery occlusion. Left common iliac artery is patent. There is severe disease in both external iliac arteries. Extensive stool burden throughout the colon with fecal impaction in the rectum. Mixed density in the presacral space likely represents an inflammatory process associated with fecal impaction. L4 compression deformity as described.  Antibiotics:  None   Code Status: Full  Family Communication: Pt at bedside  Disposition Plan: SNF Monday   HPI/Subjective: No events overnight.   Objective: Filed Vitals:   02/27/14 2055 02/28/14 0444 02/28/14 0959 02/28/14 1436  BP: 132/79 136/84  128/72  Pulse: 75 67  63  Temp: 98.4 F (36.9 C) 98.5 F (36.9 C)  98.1 F (36.7 C)  TempSrc: Oral Oral  Oral  Resp: 17 17  18   Height:      Weight:      SpO2: 97% 94% 97% 100%    Intake/Output Summary (Last 24 hours) at 02/28/14 1510 Last data filed at 02/28/14 1300  Gross per 24 hour  Intake    480 ml  Output   1200 ml  Net   -720 ml    Exam:   General:  Pt is alert, follows commands appropriately, not in acute distress  Cardiovascular: Regular rate and rhythm, no rubs, no gallops  Respiratory: Clear to  auscultation bilaterally, no wheezing, no crackles, no rhonchi  Abdomen: Soft, non tender, non distended, bowel sounds present, no guarding  Extremities: No edema, pulses DP and PT palpable bilaterally  Data Reviewed: Basic Metabolic Panel:  Recent Labs Lab 02/26/14 2218  NA 140  K 4.2  CL 99  CO2 30  GLUCOSE 131*  BUN 17  CREATININE 0.80  CALCIUM 9.2  MG 2.2   Liver Function Tests:  Recent Labs Lab  02/26/14 2218  AST 33  ALT 34  ALKPHOS 82  BILITOT 0.3  PROT 6.5  ALBUMIN 2.9*   No results found for this basename: LIPASE, AMYLASE,  in the last 168 hours No results found for this basename: AMMONIA,  in the last 168 hours CBC:  Recent Labs Lab 02/26/14 2218 02/27/14 0130  WBC 8.5 9.0  NEUTROABS 5.8  --   HGB 13.7 13.8  HCT 40.9 41.0  MCV 90.5 90.3  PLT 314 319   Cardiac Enzymes:  Recent Labs Lab 02/26/14 2218 02/27/14 0130 02/27/14 0740  TROPONINI <0.30 <0.30 <0.30   Scheduled Meds: . aspirin EC  81 mg Oral Daily  . atorvastatin  10 mg Oral q1800  . diltiazem  240 mg Oral Daily  . docusate sodium  100 mg Oral BID  . enoxaparin (LOVENOX) injection  40 mg Subcutaneous Q24H  . ezetimibe  5 mg Oral Daily  . senna  1 tablet Oral BID  . tiotropium  18 mcg Inhalation Daily   Continuous Infusions:   Faye Ramsay, MD  [facility] Pager [phone]  If 7PM-7AM, please contact night-coverage [email or web address] Password [email or web address] 02/28/2014, 3:10 PM   LOS: 2 days

## 2014-02-28 NOTE — Progress Notes (Signed)
Physical Therapy Treatment Patient Details Name: Amy Bender MRN: [id number] DOB: [birth year] Today's Date: 02/28/2014    History of Present Illness Amy Bender  57 y WF  PMHx diastolic CHF, dementia, AAA 6.8-6.9 cm by CT at [facility] 09/18/2013, angina, HLD, HTN, CAD, COPD (diffuse emphysema/bronchiectasis) diagnosed by CT scan 09/18/2013. Perforation approximately one month ago slipped while trying to open a refrigerator door fell to the floor onto her right side. States for approximately the last month has been between home and [facility] in [place]. Patient was also seen by Dr. Corky Sox at [facility] (vascular surgery) and appears offered patient surgical repair of AAA however within notes states that family decided against repair (patient states has no family).Patient states fell approximately one month ago patient presents with right femoral neck fracture which occurred approximately a month ago per patient. Dr. Bridgett Larsson (vascular surgery)is evaluating patient secondary to her large AAA,    PT Comments    Progressing well given the difficulty with maintaining TDWB.  Will need SNF to get pretty close to an Independent level before getting home.   Follow Up Recommendations  SNF;Supervision/Assistance - 24 hour     Equipment Recommendations  Rolling walker with 5" wheels;3in1 (PT)    Recommendations for Other Services       Precautions / Restrictions Precautions Precautions: Fall Restrictions RLE Weight Bearing: Touchdown weight bearing    Mobility  Bed Mobility Overal bed mobility: Needs Assistance Bed Mobility: Supine to Sit     Supine to sit: Mod assist     General bed mobility comments: cues for sequencing, hand placement,  need for assist to sit and let pt struggle to EOB  Transfers Overall transfer level: Needs assistance Equipment used: Rolling walker (2 wheeled) Transfers: Sit to/from [facility] Sit to Stand: Mod assist;+2  physical assistance Stand pivot transfers: Mod assist;+2 physical assistance (pt a little more than TDWB on R LE during transfer)       General transfer comment: demo and cues for technique, safety, hand placement.  Lifting and steadying assist  Ambulation/Gait Ambulation/Gait assistance: Mod assist;+2 physical assistance Ambulation Distance (Feet): 2 Feet Assistive device: Rolling walker (2 wheeled) Gait Pattern/deviations:  (Swing to with difficulty maintaining TDWB)         Stairs            Wheelchair Mobility    Modified Rankin (Stroke Patients Only)       Balance Overall balance assessment: Needs assistance Sitting-balance support: Feet supported;No upper extremity supported Sitting balance-Leahy Scale: Fair Sitting balance - Comments: sits in posterior tilt with tendency to list posteriorly   Standing balance support: Bilateral upper extremity supported Standing balance-Leahy Scale: Poor                      Cognition Arousal/Alertness: Awake/alert Behavior During Therapy: WFL for tasks assessed/performed;Anxious Overall Cognitive Status: Within Functional Limits for tasks assessed                      Exercises Total Joint Exercises Ankle Circles/Pumps: AROM;Both;15 reps;Supine Quad Sets: AROM;Both;10 reps;Supine Heel Slides: AROM;AAROM;Both;10 reps;Supine Hip ABduction/ADduction: AAROM;Right;10 reps    General Comments        Pertinent Vitals/Pain sats maintained at 92/93% on RA during therapy.    Home Living                      Prior Function  PT Goals (current goals can now be found in the care plan section) Acute Rehab PT Goals Patient Stated Goal: ultimately get back to my house PT Goal Formulation: With patient Time For Goal Achievement: 03/13/14 Potential to Achieve Goals: Fair Progress towards PT goals: Progressing toward goals    Frequency  Min 3X/week    PT Plan Current plan remains  appropriate    End of Session   Activity Tolerance: Patient tolerated treatment well;Patient limited by pain;Other (comment) Patient left: in chair;with call bell/phone within reach;with family/visitor present     Time: 6333-5456 PT Time Calculation (min): 31 min  Charges:  $Therapeutic Exercise: 8-22 mins $Therapeutic Activity: 8-22 mins                    G Codes:      Rajat Staver, Tessie Fass 02/28/2014, 2:39 PM 02/28/2014  Donnella Sham, [place] [phone]  (pager)

## 2014-03-01 LAB — BASIC METABOLIC PANEL
BUN: 15 mg/dL (ref 6–23)
CO2: 29 meq/L (ref 19–32)
CREATININE: 0.76 mg/dL (ref 0.50–1.10)
Calcium: 9.3 mg/dL (ref 8.4–10.5)
Chloride: 97 mEq/L (ref 96–112)
GFR calc non Af Amer: 75 mL/min — ABNORMAL LOW (ref >90)
GFR, EST AFRICAN AMERICAN: 87 mL/min — AB (ref >90)
Glucose, Bld: 93 mg/dL (ref 70–99)
POTASSIUM: 4.7 meq/L (ref 3.7–5.3)
Sodium: 137 mEq/L (ref 137–147)

## 2014-03-01 LAB — CBC
HEMATOCRIT: 41 % (ref 36.0–46.0)
HEMOGLOBIN: 14.2 g/dL (ref 12.0–15.0)
MCH: 31.3 pg (ref 26.0–34.0)
MCHC: 34.6 g/dL (ref 30.0–36.0)
MCV: 90.5 fL (ref 78.0–100.0)
Platelets: 320 10*3/uL (ref 150–400)
RBC: 4.53 MIL/uL (ref 3.87–5.11)
RDW: 15.1 % (ref 11.5–15.5)
WBC: 9.4 10*3/uL (ref 4.0–10.5)

## 2014-03-01 NOTE — Progress Notes (Signed)
Patient ID: Amy Bender, female   DOB: Sep 09, 1926, 80 y.o.   MRN: [id number] [facility] PROGRESS NOTE  Amy Bender SWN:[id number] DOB: 23-Dec-1925 DOA: 02/26/2014 PCP: PASI, MOHIT, MD  Brief narrative:  80 year-old female with past medical history of diastolic CHF, dementia, AAA 6.8 - 6.9 cm on CT 09/18/2013, dyslipidemia, hypertension, CAD, COPD with diffuse emphysema who presented to [facility] ED 02/26/2014 status post fall 4 weeks prior to this admission. Right hip x-ray on admission showed right femoral neck fracture. Also seen was a large abdominal aortic aneurysm, 9.3 cm. This was confirmed on CT angio abdomen, diameter of the aneurysm was however 8 cm.   Assessment and plan:  Principal Problem:  Fracture of neck of right femur  - Subacute R subcapital valgus impacted R femoral neck fracture  - Stable and per orthopedic surgery it will be managed nonoperatively  - Pain management with oxycodone 5 mg every 4 hours as needed  - plan d/c SNF in AM if bed available  Active Problems:  AAA (abdominal aortic aneurysm) without rupture  - per vascular surgery patient has anatomic issues that prevent her from getting an endograft repair which would lengthen an open repair. Considering her age and co-morbidities, she is at increased risk for operative mortality, likely >10%.  - Per vascular, pt remains asymptomatic so no intervention is immediately needed  - VVS signed off HLD (hyperlipidemia)  - continue statin therapy  HTN (hypertension)  - continue diltiazem 240 mg daily  CAD (coronary artery disease) of artery bypass graft  - stable; continue aspirin adn statin therapy  COPD - continue spiriva  - stable  Constipation  - Continue Colace and senna   Consultants:  Orthopedic surgery  Vascular surgery  Procedures/Studies:  Dg Hip Complete Right 02/27/2014 IMPRESSION: 1. Right femoral neck angulated fracture 2. Large abdominal aortic aneurysm measuring up to 9.3 cm. CTA of the  abdomen and pelvis suggested for further evaluation.  Dg Knee Right 02/27/2014 IMPRESSION: 1. No acute bony or joint abnormality. Degenerative change present. 2. Peripheral vascular disease.  Ct Angio Abd/pel W/ And/or W/o 02/27/2014 IMPRESSION: Large abdominal aortic aneurysm with a maximal diameter of 8.0 cm. 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 SMA is patent. IMA origin is occluded. Right common iliac artery occlusion. Left common iliac artery is patent. There is severe disease in both external iliac arteries. Extensive stool burden throughout the colon with fecal impaction in the rectum. Mixed density in the presacral space likely represents an inflammatory process associated with fecal impaction. L4 compression deformity as described.  Antibiotics:  None   Code Status: Full  Family Communication: Pt at bedside  Disposition Plan: SNF Monday   HPI/Subjective: No events overnight.   Objective: Filed Vitals:   02/28/14 1436 02/28/14 2236 03/01/14 0425 03/01/14 0843  BP: 128/72 139/72 165/71   Pulse: 63 72 73   Temp: 98.1 F (36.7 C) 99.5 F (37.5 C) 98.5 F (36.9 C)   TempSrc: Oral Oral Oral   Resp: 18 18 18    Height:      Weight:      SpO2: 100% 95% 96% 98%    Intake/Output Summary (Last 24 hours) at 03/01/14 1011 Last data filed at 03/01/14 0509  Gross per 24 hour  Intake    480 ml  Output   1350 ml  Net   -870 ml    Exam:   General:  Pt is alert, follows commands appropriately, not in acute distress  Cardiovascular: Regular rate and rhythm, S1/S2, no murmurs, no rubs, no gallops  Respiratory: Clear to auscultation bilaterally, no wheezing, no crackles, no rhonchi  Abdomen: Soft, non tender, non distended, bowel sounds present, no guarding  Data Reviewed: Basic Metabolic Panel:  Recent Labs Lab 02/26/14 2218 03/01/14 0315  NA 140 137  K 4.2 4.7  CL 99 97  CO2 30 29  GLUCOSE 131* 93  BUN 17 15  CREATININE 0.80 0.76  CALCIUM 9.2 9.3  MG 2.2   --    Liver Function Tests:  Recent Labs Lab 02/26/14 2218  AST 33  ALT 34  ALKPHOS 82  BILITOT 0.3  PROT 6.5  ALBUMIN 2.9*   CBC:  Recent Labs Lab 02/26/14 2218 02/27/14 0130 03/01/14 0315  WBC 8.5 9.0 9.4  NEUTROABS 5.8  --   --   HGB 13.7 13.8 14.2  HCT 40.9 41.0 41.0  MCV 90.5 90.3 90.5  PLT 314 319 320   Cardiac Enzymes:  Recent Labs Lab 02/26/14 2218 02/27/14 0130 02/27/14 0740  TROPONINI <0.30 <0.30 <0.30    Scheduled Meds: . aspirin EC  81 mg Oral Daily  . atorvastatin  10 mg Oral q1800  . diltiazem  240 mg Oral Daily  . docusate sodium  100 mg Oral BID  . enoxaparin (LOVENOX) injection  40 mg Subcutaneous Q24H  . ezetimibe  5 mg Oral Daily  . senna  1 tablet Oral BID  . tiotropium  18 mcg Inhalation Daily   Continuous Infusions:    Faye Ramsay, MD  [facility] Pager [phone]  If 7PM-7AM, please contact night-coverage [email or web address] Password [email or web address] 03/01/2014, 10:11 AM   LOS: 3 days

## 2014-03-01 NOTE — Progress Notes (Signed)
Patient's foley catheter removed per physician order, six hours post removal patient has not been able to void. Patient bladder scanned found to have 504 ml. Patient in/out cath per protocol. Will continue to monitor.

## 2014-03-01 NOTE — Progress Notes (Signed)
Physical Therapy Treatment Patient Details Name: Amy Bender MRN: [id number] DOB: 27-Aug-1926 Today's Date: 03/01/2014    History of Present Illness      PT Comments    Pt continues to be limited by pain.  RLE positioned in ER and adduction during stance.  Follow Up Recommendations  SNF;Supervision/Assistance - 24 hour     Equipment Recommendations  Rolling walker with 5" wheels;3in1 (PT)    Recommendations for Other Services       Precautions / Restrictions Precautions Precautions: Fall Restrictions RLE Weight Bearing: Touchdown weight bearing    Mobility  Bed Mobility         Supine to sit: Mod assist     General bed mobility comments: verbal cues for sequencing  Transfers   Equipment used: Rolling walker (2 wheeled)   Sit to Stand: Mod assist;+2 physical assistance Stand pivot transfers: Mod assist;+2 physical assistance       General transfer comment: verbal cues for sequencing  Ambulation/Gait                 Stairs            Wheelchair Mobility    Modified Rankin (Stroke Patients Only)       Balance                                    Cognition Arousal/Alertness: Awake/alert Behavior During Therapy: WFL for tasks assessed/performed Overall Cognitive Status: Within Functional Limits for tasks assessed                      Exercises      General Comments        Pertinent Vitals/Pain 7/10    Home Living                      Prior Function            PT Goals (current goals can now be found in the care plan section) Progress towards PT goals: Progressing toward goals    Frequency  Min 3X/week    PT Plan Current plan remains appropriate    End of Session Equipment Utilized During Treatment: Gait belt Activity Tolerance: Patient limited by pain Patient left: in chair;with call bell/phone within reach     Time: 1011-1028 PT Time Calculation (min): 17 min  Charges:   $Therapeutic Activity: 8-22 mins                    G Codes:      Lorriane Shire 03/01/2014, 10:36 AM

## 2014-03-01 NOTE — Progress Notes (Signed)
Physical Therapy Treatment Patient Details Name: ALIYANAH ROZAS MRN: [id number] DOB: [birth year] Today's Date: 03/01/2014    History of Present Illness      PT Comments    Pt able to tolerate sitting up in recliner x 5 hours today.  Follow Up Recommendations  SNF;Supervision/Assistance - 24 hour     Equipment Recommendations  Rolling walker with 5" wheels;3in1 (PT)    Recommendations for Other Services       Precautions / Restrictions Precautions Precautions: Fall Restrictions RLE Weight Bearing: Touchdown weight bearing    Mobility  Bed Mobility           Sit to supine: Mod assist   General bed mobility comments: verbal cues for sequencing, increased time required to complete with decreased assist level  Transfers   Equipment used: Rolling walker (2 wheeled)   Sit to Stand: Mod assist;+2 physical assistance Stand pivot transfers: Mod assist;+2 physical assistance       General transfer comment: verbal cues for sequencing, safety, and TDWB RLE  Ambulation/Gait                 Stairs            Wheelchair Mobility    Modified Rankin (Stroke Patients Only)       Balance                                    Cognition Arousal/Alertness: Awake/alert Behavior During Therapy: WFL for tasks assessed/performed Overall Cognitive Status: Within Functional Limits for tasks assessed                      Exercises      General Comments        Pertinent Vitals/Pain     Home Living                      Prior Function            PT Goals (current goals can now be found in the care plan section) Progress towards PT goals: Progressing toward goals    Frequency  Min 3X/week    PT Plan Current plan remains appropriate    End of Session Equipment Utilized During Treatment: Gait belt Activity Tolerance: Patient tolerated treatment well Patient left: in bed;with call bell/phone within reach;with  family/visitor present     Time: 3419-3790 PT Time Calculation (min): 24 min  Charges:  $Therapeutic Activity: 23-37 mins                    G Codes:      Lorriane Shire 03/01/2014, 4:04 PM  Lorrin Goodell, PT  Office # [phone] Pager [phone]

## 2014-03-02 LAB — CBC
HCT: 41.9 % (ref 36.0–46.0)
HEMOGLOBIN: 14 g/dL (ref 12.0–15.0)
MCH: 30.2 pg (ref 26.0–34.0)
MCHC: 33.4 g/dL (ref 30.0–36.0)
MCV: 90.5 fL (ref 78.0–100.0)
Platelets: 330 10*3/uL (ref 150–400)
RBC: 4.63 MIL/uL (ref 3.87–5.11)
RDW: 15.1 % (ref 11.5–15.5)
WBC: 8.9 10*3/uL (ref 4.0–10.5)

## 2014-03-02 LAB — BASIC METABOLIC PANEL
BUN: 17 mg/dL (ref 6–23)
CO2: 27 mEq/L (ref 19–32)
CREATININE: 0.78 mg/dL (ref 0.50–1.10)
Calcium: 9.1 mg/dL (ref 8.4–10.5)
Chloride: 97 mEq/L (ref 96–112)
GFR, EST AFRICAN AMERICAN: 86 mL/min — AB (ref >90)
GFR, EST NON AFRICAN AMERICAN: 74 mL/min — AB (ref >90)
Glucose, Bld: 86 mg/dL (ref 70–99)
Potassium: 4 mEq/L (ref 3.7–5.3)
Sodium: 140 mEq/L (ref 137–147)

## 2014-03-02 MED ORDER — OXYCODONE HCL 5 MG PO TABS: 5.0000 mg | ORAL_TABLET | ORAL | Status: DC | PRN

## 2014-03-02 MED ORDER — ACETAMINOPHEN 325 MG PO TABS: 650.0000 mg | ORAL_TABLET | Freq: Four times a day (QID) | ORAL | Status: AC | PRN

## 2014-03-02 MED ORDER — ENOXAPARIN SODIUM 40 MG/0.4ML ~~LOC~~ SOLN: 40.0000 mg | SUBCUTANEOUS | Status: AC

## 2014-03-02 NOTE — Progress Notes (Addendum)
Update: CSW provided bed offers to patient and patient's cousin over the phone and other cousin by bedside. CSW explained that right now the only bed offers in [name], [name] are [name] and [name]. CSW explained that if patient's family's preferences come available while patient is in another facility, the social workers at the SNF can help patient transition into a different snf. Patient and patient's family state they understand and will choose [name] in the event that one of their choices does not come available by tomorrow morning.  CSW went into patient's room and patient was on the phone and handed social worker the phone to speak with patient's cousin [name]. Patient's cousin states that the family's preference is [name] in [name] has contacted facility and is awaiting phone call back. Patient states she is ready to go. CSW explained that once a bed is found, patient can be discharged. CSW continuing to follow  Amy Bender, MSW, [place]

## 2014-03-02 NOTE — Discharge Summary (Addendum)
Physician Discharge Summary  Amy Bender TOI:[id number] DOB: [birth year] DOA: 02/26/2014  PCP: Amy Guest, MD  Admit date: 02/26/2014 Discharge date: 03/03/2014  Time spent: 59minutes  Recommendations for Outpatient Follow-up:   Right femur neck fracture  -[facility]my [place] (orthopedic surgery);Subacute Rt subcapital valgus impacted R femoral neck fracture. Non-operative -Orthopedics recommends outpatient followup with Amy Bender (orthopedic surgery [facility])   AAA  -Amy Bender/ [place] (vascular surgery); Pt has been evaluated at [facility] for elective repair and should resume her care with Amy [facility]ender [facility] vascular surgery) if she wishes elective repair  Diastolic CHF  -Diltiazem ER 240 mg daily  -troponin x3 negative -a cardiogram; see results below -patient's BP would not tolerate additional BP medication such as ACE inhibitor, beta blocker. -PCP to determine efficacy of descending CCB and start patient on ACE inhibitor or beta blocker -discharge on aspirin 81 mg daily  Anginal pain  -See diastolic CHF     HLD  -resume home medication regimen of Amy Bender a 10 mg daily + Crestor 10 mg QHS   -PCP to obtain lipid panel and adjust medication accordingly   COPD  -Respiratory to titrate O2 to maintain SpO2> 90%  -Spiriva daily   Bronchiectasis  -See COPD   Nicotine dependence  -patient counseled by myself and several members of the medical staff to DC smoking; on interested in stopping her cigarette smoking.   Subacute hypothyroidism -Discharge on Synthroid 12.5 mcg daily -PCP to recheck TSH/free T4 level in 8 weeks      Discharge Diagnoses:  Principal Problem:   Fracture of neck of right femur Active Problems:   AAA (abdominal aortic aneurysm) without rupture   HLD (hyperlipidemia)   HTN (hypertension)   CAD (coronary artery disease) of artery bypass graft   Other emphysema   Bronchiectasis without acute exacerbation   Anginal  pain   Diastolic CHF, chronic   Nicotine dependence   Subacute hypothyroidism   Discharge Condition: Stable Diet recommendation: Heart healthy   Filed Weights   02/26/14 1741  Weight: 57.657 kg (127 lb 1.8 oz)    History of present illness:  Amy Bender 14 y WF PMHx diastolic CHF, dementia, AAA 6.8-6.9 cm by CT at [facility] 09/18/2013, angina, HLD, HTN, CAD, COPD (diffuse emphysema/bronchiectasis) diagnosed by CT scan 09/18/2013. Perforation approximately one month ago slipped while trying to open a refrigerator door fell to the floor onto her right side. States for approximately the last month has been between home and [facility] in [place]. Patient was also seen by Amy Bender at [facility] (vascular surgery) and appears offered patient surgical repair of AAA however within notes states that family decided against repair (patient states has no family).Patient states fell approximately one month ago patient presents with right femoral neck fracture which occurred approximately a month ago per patient. Amy Bender (vascular surgery)is evaluating patient secondary to her large AAA,  Amy Bender/Amy [facility]ender (orthopedic surgery)is evaluating patient for surgical repair vs conservative management.patient currently complains of no pain, although states really need to cigarette.3/31 no events over night  Procedure  Echocardiogram at Amy Bender 02/16/2014  -Moderate LVH  -LVEF= 60%  -Grade 1 diastolic dysfunction  -Borderline pulmonary hypertension    Antibiotics     Consultation  Amy Bender (vascular surgery)  Amy Bender(orthopedic surgery)    Discharge Exam: Filed Vitals:   03/02/14 1548 03/02/14 2133 03/03/14 0453 03/03/14 0952  BP: 123/75  128/71 120/63   Pulse: 87 86 94   Temp: 100 F (37.8 C) 98.8 F (37.1 C) 98.6 F (37 C)   TempSrc: Oral Oral Oral   Resp: 18 18 17    Height:      Weight:      SpO2: 92% 92% 90% 92%   General:  A./O. x4, patient tends to wander off subject, NAD  Neck: negative JVD, negative lymphadenopathy  Cardiovascular: regular rate, negative murmurs rubs gallops  Respiratory: clear to auscultation bilateral  Abdomen: soft, nontender, nondistended, plus bowel sounds  Musculoskeletal: right leg sits in anatomically correct position,DP/PT pulse bilateral lower extremities 1+      Discharge Instructions      Discharge Orders   Future Orders Complete By Expires   Touch down weight bearing  As directed    Questions:     Laterality:  right   Extremity:  Lower       Medication List    TAKE these medications       acetaminophen 325 MG tablet  Commonly known as:  TYLENOL  Take 2 tablets (650 mg total) by mouth every 6 (six) hours as needed for mild pain (or Fever >/= 101).     enoxaparin 40 MG/0.4ML injection  Commonly known as:  LOVENOX  Inject 0.4 mLs (40 mg total) into the skin daily.     oxyCODONE 5 MG immediate release tablet  Commonly known as:  Oxy IR/ROXICODONE  Take 1 tablet (5 mg total) by mouth every 4 (four) hours as needed for moderate pain.      ASK your doctor about these medications       aspirin 81 MG tablet  Take 81 mg by mouth daily.     diltiazem 240 MG 24 hr capsule  Commonly known as:  CARDIZEM CD  Take 240 mg by mouth daily.     ezetimibe 10 MG tablet  Commonly known as:  ZETIA  Take 5 mg by mouth daily.     rosuvastatin 10 MG tablet  Commonly known as:  CRESTOR  Take 5 mg by mouth at bedtime.       No Known Allergies Follow-up Information   Follow up with Amy Bender. Schedule an appointment as soon as possible for a visit in 2 weeks. [facility] followup subacute right subcapital valgus impacted R femoral neck fracture.)    Specialty:  Orthopedic Surgery   Contact information:   [place] DE[facility], [id number] [place] [address] [phone]       Follow up with Amy Ripple, MD In 1 month. [facility] Followup; enlarging AAA. He elective repair?)    Specialty:  Vascular Surgery   Contact information:   [address] SURGERY, [id number] [place] [address] [phone]       Follow up with PASI, MOHIT, MD. Schedule an appointment as soon as possible for a visit in 2 weeks. [facility] followup, followup diastolic CHF, anginal pain)    Specialty:  Cardiology   Contact information:   [place], [place] [facility] [place]  [id number] [phone]        The results of significant diagnostics from this hospitalization (including imaging, microbiology, ancillary and laboratory) are listed below for reference.    Significant Diagnostic Studies: Dg Hip Complete Right  02/27/2014   CLINICAL DATA:  Fracture.  EXAM: RIGHT HIP - COMPLETE 2+ VIEW  COMPARISON:  None.  FINDINGS: Angulated  right femoral neck fracture is present. Diffuse osteopenia and degenerative change noted throughout the lumbar spine, both hips. A large 9.3 cm in transverse diameter peripherally calcified mass noted in the lower abdomen. This is most likely a large abdominal aortic aneurysm. CTA is suggested for further evaluation. Peripheral vascular calcification.  IMPRESSION: 1. Right femoral neck angulated fracture 2. Large abdominal aortic aneurysm measuring up to 9.3 cm. CTA of the abdomen and pelvis suggested for further evaluation. These results will be called to the ordering clinician or representative by the Radiologist Assistant, and communication documented in the PACS Dashboard.   Electronically Signed   By: Marcello Moores  Register   On: 02/27/2014 07:26   Dg Knee 1-2 Views Right  02/27/2014   CLINICAL DATA:  Fall.  EXAM: RIGHT KNEE - 1-2 VIEW  COMPARISON:  None.  FINDINGS: No acute bony or joint abnormality. Diffuse degenerative change. Atherosclerotic vascular calcification noted throughout the femoral popliteal system.  IMPRESSION: 1. No acute bony or joint  abnormality.  Degenerative change present. 2. Peripheral vascular disease.   Electronically Signed   By: Marcello Moores  Register   On: 02/27/2014 07:23   Ct Angio Abd/pel W/ And/or W/o  02/27/2014   CLINICAL DATA:  Known abdominal aortic an  EXAM: CT ANGIOGRAPHY ABDOMEN AND PELVIS WITH CONTRAST AND WITHOUT CONTRAST  TECHNIQUE: Multidetector CT imaging of the abdomen and pelvis was performed using the standard protocol during bolus administration of intravenous contrast. Multiplanar reconstructed images and MIPs were obtained and reviewed to evaluate the vascular anatomy.  CONTRAST:  150mL OMNIPAQUE IOHEXOL 350 MG/ML SOLN  COMPARISON:  None.  FINDINGS: An infrarenal abdominal aortic aneurysm is present. Maximal oblique measures of the aneurysm sac at table position 261.8 R 8.0 and 7.2 cm. There is a large amount of chronic appearing mural thrombus within the aneurysm sac and a rather narrow channel of flow through the aneurysm. There is calcified material within the aneurysm sac. There is some higher density within the chronic thrombus which may represent more recent chronic thrombus.  There is severe narrowing in the proximal celiac axis extending to cm. Branch vessels are patent.  SMA is patent. Pancreatic branches are hypertrophied for collateral flow to the celiac territory. Branch vessels are patent.  IMA origin is occluded.  Branch vessels reconstitute.  Single renal arteries are patent bilaterally.  The right common iliac artery is occluded. Right external iliac artery is heavily calcified. It is severely diseased and diminutive. Right internal iliac artery branches reconstitute.  Left common iliac artery is heavily calcified and grossly patent. Left external iliac artery is heavily calcified and severely diseased. Left internal iliac artery branches reconstitute and are heavily calcified.  There is heavy calcified plaque in the right common femoral artery which is also diminutive. The proximal superficial femoral  and profunda femoral arteries on the right are moderately diseased.  Left common femoral artery is heavily calcified. Significant narrowing is suspected distally. Moderate disease of the left superficial femoral and profunda femoral arteries in the proximal thigh.  Lumbar artery is are hypertrophied for collateral flow. Superficial muscular arteries in bilateral inguinal regions are also hypertrophied for collateral flow.  Bibasilar atelectasis versus airspace disease. Small bilateral pleural effusions.  Small pericardial effusion. Three vessel coronary artery calcifications. Mild cardiomegaly.  Diffuse calcified and some irregular plaque of the visualized lower thoracic aorta. There is a penetrating atherosclerotic ulcer at the diaphragmatic hiatus at table position 425.8. No definite dissection.  Liver enhancement is heterogeneous likely due to phase of  contrast. No obvious lesion.  Gallbladder, pancreas, and right kidney are within normal limits  Adrenal glands are somewhat prominent in a mild hyperplasia pattern.  There is scarring and atrophy of the left kidney. Small hypodensities cannot be characterized.  The spleen is irregular but without focal mass or injury.  Extensive stool burden throughout the length of the colon. Nonspecific mixed density thickening of the presacral space is noted. Fecal impaction in the rectum is associated. No obvious bony destruction. An inflammatory process of the rectum is not excluded.  Foley catheter decompresses the bladder.  Pelvic calcification on image 148 of series 8 May represent a small calcified fibroid.  Mild L4 wedge compression deformity with 20% loss of height anteriorly. Age is indeterminate. Advanced degenerative changes in the lumbar spine with scoliosis.  Review of the MIP images confirms the above findings.  IMPRESSION: Large abdominal aortic aneurysm with a maximal diameter of 8.0 cm.  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 SMA is patent. IMA  origin is occluded.  Right common iliac artery occlusion. Left common iliac artery is patent. There is severe disease in both external iliac arteries.  Extensive stool burden throughout the colon with fecal impaction in the rectum. Mixed density in the presacral space likely represents an inflammatory process associated with fecal impaction.  L4 compression deformity as described.   Electronically Signed   By: Maryclare Bean M.D.   On: 02/27/2014 08:39    Microbiology: No results found for this or any previous visit (from the past 240 hour(s)).   Labs: Basic Metabolic Panel:  Recent Labs Lab 02/26/14 2218 03/01/14 0315 03/02/14 0350  NA 140 137 140  K 4.2 4.7 4.0  CL 99 97 97  CO2 30 29 27   GLUCOSE 131* 93 86  BUN 17 15 17   CREATININE 0.80 0.76 0.78  CALCIUM 9.2 9.3 9.1  MG 2.2  --   --    Liver Function Tests:  Recent Labs Lab 02/26/14 2218  AST 33  ALT 34  ALKPHOS 82  BILITOT 0.3  PROT 6.5  ALBUMIN 2.9*   No results found for this basename: LIPASE, AMYLASE,  in the last 168 hours No results found for this basename: AMMONIA,  in the last 168 hours CBC:  Recent Labs Lab 02/26/14 2218 02/27/14 0130 03/01/14 0315 03/02/14 0350  WBC 8.5 9.0 9.4 8.9  NEUTROABS 5.8  --   --   --   HGB 13.7 13.8 14.2 14.0  HCT 40.9 41.0 41.0 41.9  MCV 90.5 90.3 90.5 90.5  PLT 314 319 320 330   Cardiac Enzymes:  Recent Labs Lab 02/26/14 2218 02/27/14 0130 02/27/14 0740  TROPONINI <0.30 <0.30 <0.30   BNP: BNP (last 3 results) No results found for this basename: PROBNP,  in the last 8760 hours CBG: No results found for this basename: GLUCAP,  in the last 168 hours     Signed:  Dia Crawford, MD [facility] [phone] pager

## 2014-03-02 NOTE — Progress Notes (Signed)
Physical Therapy Treatment Patient Details Name: Amy Bender MRN: [id number] DOB: Jan 08, 1926 Today's Date: 03/02/2014    History of Present Illness Amy Bender  61 y WF  PMHx diastolic CHF, dementia, AAA 6.8-6.9 cm by CT at [facility] 09/18/2013, angina, HLD, HTN, CAD, COPD (diffuse emphysema/bronchiectasis) diagnosed by CT scan 09/18/2013. Perforation approximately one month ago slipped while trying to open a refrigerator door fell to the floor onto her right side. States for approximately the last month has been between home and [facility] in [place]. Patient was also seen by Dr. Corky Sox at [facility] (vascular surgery) and appears offered patient surgical repair of AAA however within notes states that family decided against repair (patient states has no family).Patient states fell approximately one month ago patient presents with right femoral neck fracture which occurred approximately a month ago per patient. Dr. Bridgett Larsson (vascular surgery)is evaluating patient secondary to her large AAA,    PT Comments    Pt limited by R LE pain with mobility and generalized weakness.  Continues to require +2 assistance for transfers and mobility.  Pt remains a good SNF candidate.  Follow Up Recommendations  SNF     Equipment Recommendations  None recommended by PT    Recommendations for Other Services       Precautions / Restrictions Precautions Precautions: Fall Restrictions RLE Weight Bearing: Touchdown weight bearing    Mobility  Bed Mobility               General bed mobility comments: pt seated edge of bed at PT arrival  Transfers   Equipment used: Rolling walker (2 wheeled)   Sit to Stand: +2 physical assistance;Mod assist Stand pivot transfers: +2 physical assistance;Max assist       General transfer comment: Pt transfered bed to [facility] and BSC to recliner requiring +2 mod A to stand, cues for UE and LE placement, manual facilitation for trunk  extension, attempt manual facilitation for L knee extension but pt states "I can't stand straight".  Pt able to stand with mod A with RW x 1 minute for hygiene after toileting, max A to pivot to chair with max cuing for wt shifting and precautions  Ambulation/Gait                 Stairs            Wheelchair Mobility    Modified Rankin (Stroke Patients Only)       Balance                                    Cognition Arousal/Alertness: Awake/alert Behavior During Therapy: WFL for tasks assessed/performed Overall Cognitive Status: Within Functional Limits for tasks assessed                      Exercises General Exercises - Lower Extremity Ankle Circles/Pumps: AROM;Both;20 reps Gluteal Sets: AROM;Both;10 reps    General Comments        Pertinent Vitals/Pain Pt c/o 6/10 R LE pain with mobility, RN aware. Repositioned as appropriate    Home Living                      Prior Function            PT Goals (current goals can now be found in the care plan section) Progress towards PT goals: Progressing toward  goals    Frequency  Min 3X/week    PT Plan Current plan remains appropriate    End of Session Equipment Utilized During Treatment: Gait belt Activity Tolerance: Patient limited by fatigue;Patient limited by pain Patient left: in chair;with call bell/phone within reach     Time: 9485-4627 PT Time Calculation (min): 28 min  Charges:  $Therapeutic Activity: 23-37 mins                    G Codes:      DONAWERTH,KAREN Mar 23, 2014, 8:41 AM

## 2014-03-02 NOTE — Care Management Note (Signed)
    Page 1 of 1   03/03/2014     5:11:11 PM   CARE MANAGEMENT NOTE 03/03/2014  Patient:  Amy Bender, Amy Bender   Account Number:  [id number]  Date Initiated:  03/02/2014  Documentation initiated by:  Jinger Middlesworth  Subjective/Objective Assessment:   PT ADM ON 3/26 S/P HIP FX; ALSO HAS 9CM AAA.  PTA, PT RESIDES WITH TWO FRIENDS.     Action/Plan:   PT WILL NEED SNF FOR REHAB AT DC; CSW FOLLOWING TO FACILITATE DC TO SNF WHEN  MEDICALLY STABLE FOR DC.   Anticipated DC Date:  03/03/2014   Anticipated DC Plan:  SKILLED NURSING FACILITY  In-house referral  Clinical Social Worker      DC Planning Services  CM consult      Choice offered to / List presented to:             Status of service:  Completed, signed off Medicare Important Message given?   (If response is "NO", the following Medicare IM given date fields will be blank) Date Medicare IM given:   Date Additional Medicare IM given:    Discharge Disposition:  [place]  Per UR Regulation:  Reviewed for med. necessity/level of care/duration of stay  If discussed at [place] of Stay Meetings, dates discussed:   03/03/2014    Comments:  03/03/14 Lovel Suazo,RN,BSN [phone] PT FOR DC TO SNF TODAY, PER CSW ARRANGEMENTS.

## 2014-03-02 NOTE — Progress Notes (Signed)
Orthopaedic Trauma Service Progress Note  Subjective  Doing well Worked well with PT over weekend Pain R hip tolerable    Objective   BP 141/74  Pulse 85  Temp(Src) 97.6 F (36.4 C) (Oral)  Resp 18  Ht 5\' 6"  (1.676 m)  Wt 57.657 kg (127 lb 1.8 oz)  BMI 20.53 kg/m2  SpO2 97%  Intake/Output     03/29 0701 - 03/30 0700 03/30 0701 - 03/31 0700   P.O. 0    Total Intake(mL/kg)     Urine (mL/kg/hr) 600 (0.4)    Stool 1 (0)    Total Output 601     Net -601          Stool Occurrence 1 x      Labs  Results for Amy Bender, Amy Bender (MRN [id number]) as of 03/02/2014 10:13  Ref. Range 03/02/2014 03:50  Sodium Latest Range: 137-147 mEq/L 140  Potassium Latest Range: 3.7-5.3 mEq/L 4.0  Chloride Latest Range: 96-112 mEq/L 97  CO2 Latest Range: 19-32 mEq/L 27  BUN Latest Range: 6-23 mg/dL 17  Creatinine Latest Range: 0.50-1.10 mg/dL 0.78  Calcium Latest Range: 8.4-10.5 mg/dL 9.1  GFR calc non Af Amer Latest Range: >90 mL/min 74 (L)  GFR calc Af Amer Latest Range: >90 mL/min 86 (L)  Glucose Latest Range: 70-99 mg/dL 86  WBC Latest Range: 4.0-10.5 K/uL 8.9  RBC Latest Range: 3.87-5.11 MIL/uL 4.63  Hemoglobin Latest Range: 12.0-15.0 g/dL 14.0  HCT Latest Range: 36.0-46.0 % 41.9  MCV Latest Range: 78.0-100.0 fL 90.5  MCH Latest Range: 26.0-34.0 pg 30.2  MCHC Latest Range: 30.0-36.0 g/dL 33.4  RDW Latest Range: 11.5-15.5 % 15.1  Platelets Latest Range: 150-400 K/uL 330    Exam  Gen: awake and alert, NAD Ext:       Right Lower Extremity   DPN, SPN, TN sensation intact  EHL, FHL, AT, PT, peroneals, gastroc motor intact   Soft tissue unremarkable   Soft tissue over her heel is good. No signs of pressure sores  Dec pain with axial loading of R hip   No new findings noted    Assessment and Plan   POD/HD#: 60    80 y/o female s/p fall approximately 1 month ago with R femoral neck fracture and AAA, as well as numerous other medical comorbities   1. Subacute R subcapital  valgus impacted R femoral neck fracture  Non-op management                          PT/OT              TDWB with walker                           2. AAA             Per vascular               Please see note by Dr. Bridgett Larsson for for evaluation and recommendations  3. Medical issues             Per primary team  4. FEN             advance as tolerated   5. DVT/PE prophylaxis               SCD             Ok for pharmacologics from our standpoint  6. UTI  U/A was negative on admission labs             Adequately treated at other facility   7. Pain               Tylenol and low dose narcotics as needed  8. Nicotine dependence             No nicotine patch             This a continuous source of nicotine with increases the pts risk of healing complication by approximately 40%   9. Dispo             non-op             ok for SNF from ortho standpoint   Follow up with ortho in 10 days     Amy Pigg, PA-C [facility] [phone] (P) 03/02/2014 10:13 AM

## 2014-03-02 NOTE — Discharge Instructions (Signed)
[facility] Discharge Instructions,   General Discharge Instructions  WEIGHT BEARING STATUS: Touchdown weightbearing Right Leg  RANGE OF MOTION/ACTIVITY: activity as tolerated while maintaining Weightbearing restrictions   Diet: as you were eating previously.  Can use over the counter stool softeners and bowel preparations, such as Miralax, to help with bowel movements.  Narcotics can be constipating.  Be sure to drink plenty of fluids  STOP SMOKING OR USING NICOTINE PRODUCTS!!!!  As discussed nicotine severely impairs your body's ability to heal surgical and traumatic wounds but also impairs bone healing.  Wounds and bone heal by forming microscopic blood vessels (angiogenesis) and nicotine is a vasoconstrictor (essentially, shrinks blood vessels).  Therefore, if vasoconstriction occurs to these microscopic blood vessels they essentially disappear and are unable to deliver necessary nutrients to the healing tissue.  This is one modifiable factor that you can do to dramatically increase your chances of healing your injury.    (This means no smoking, no nicotine gum, patches, etc)  DO NOT USE NONSTEROIDAL ANTI-INFLAMMATORY DRUGS (NSAID'S)  Using products such as Advil (ibuprofen), Aleve (naproxen), Motrin (ibuprofen) for additional pain control during fracture healing can delay and/or prevent the healing response.  If you would like to take over the counter (OTC) medication, Tylenol (acetaminophen) is ok.  However, some narcotic medications that are given for pain control contain acetaminophen as well. Therefore, you should not exceed more than 4000 mg of tylenol in a day if you do not have liver disease.  Also note that there are may OTC medicines, such as cold medicines and allergy medicines that my contain tylenol as well.  If you have any questions about medications and/or interactions please ask your doctor/PA or your pharmacist.   PAIN MEDICATION USE AND EXPECTATIONS  You have  likely been given narcotic medications to help control your pain.  After a traumatic event that results in an fracture (broken bone) with or without surgery, it is ok to use narcotic pain medications to help control one's pain.  We understand that everyone responds to pain differently and each individual patient will be evaluated on a regular basis for the continued need for narcotic medications. Ideally, narcotic medication use should last no more than 6-8 weeks (coinciding with fracture healing).   As a patient it is your responsibility as well to monitor narcotic medication use and report the amount and frequency you use these medications when you come to your office visit.   We would also advise that if you are using narcotic medications, you should take a dose prior to therapy to maximize you participation.  IF YOU ARE ON NARCOTIC MEDICATIONS IT IS NOT PERMISSIBLE TO OPERATE A MOTOR VEHICLE (MOTORCYCLE/CAR/TRUCK/MOPED) OR HEAVY MACHINERY DO NOT MIX NARCOTICS WITH OTHER CNS (CENTRAL NERVOUS SYSTEM) DEPRESSANTS SUCH AS ALCOHOL       ICE AND ELEVATE INJURED/OPERATIVE EXTREMITY  Using ice and elevating the injured extremity above your heart can help with swelling and pain control.  Icing in a pulsatile fashion, such as 20 minutes on and 20 minutes off, can be followed.    Do not place ice directly on skin. Make sure there is a barrier between to skin and the ice pack.    Using frozen items such as frozen peas works well as the conform nicely to the are that needs to be iced.  USE AN ACE WRAP OR TED HOSE FOR SWELLING CONTROL  In addition to icing and elevation, Ace wraps or TED hose are used to help  limit and resolve swelling.  It is recommended to use Ace wraps or TED hose until you are informed to stop.    When using Ace Wraps start the wrapping distally (farthest away from the body) and wrap proximally (closer to the body)   Example: If you had surgery on your leg or thing and you do not have a  splint on, start the ace wrap at the toes and work your way up to the thigh        If you had surgery on your upper extremity and do not have a splint on, start the ace wrap at your fingers and work your way up to the upper arm  IF YOU ARE IN A SPLINT OR CAST DO NOT [place]   If your splint gets wet for any reason please contact the office immediately. You may shower in your splint or cast as long as you keep it dry.  This can be done by wrapping in a cast cover or garbage back (or similar)  Do Not stick any thing down your splint or cast such as pencils, money, or hangers to try and scratch yourself with.  If you feel itchy take benadryl as prescribed on the bottle for itching  IF YOU ARE IN A CAM BOOT (BLACK BOOT)  You may remove boot periodically. Perform daily dressing changes as noted below.  Wash the liner of the boot regularly and wear a sock when wearing the boot. It is recommended that you sleep in the boot until told otherwise  [place] [facility]: [phone]

## 2014-03-03 DIAGNOSIS — E039 Hypothyroidism, unspecified: Secondary | ICD-10-CM | POA: Diagnosis present

## 2014-03-03 MED ORDER — TIOTROPIUM BROMIDE MONOHYDRATE 18 MCG IN CAPS: 18.0000 ug | ORAL_CAPSULE | Freq: Every day | RESPIRATORY_TRACT | Status: AC

## 2014-03-03 MED ORDER — OXYCODONE HCL 5 MG PO TABS: 5.0000 mg | ORAL_TABLET | ORAL | Status: AC | PRN

## 2014-03-03 MED ORDER — LEVOTHYROXINE SODIUM 25 MCG PO TABS
12.5000 ug | ORAL_TABLET | Freq: Every day | ORAL | Status: DC
  Administered 2014-03-03: 13:00:00 via ORAL
  Filled 2014-03-03 (×2): qty 0.5

## 2014-03-03 NOTE — Progress Notes (Signed)
Clinical Social Work Department CLINICAL SOCIAL WORK PLACEMENT NOTE 03/03/2014  Patient:  Amy Bender, Amy Bender  Account Number:  [id number] Admit date:  02/26/2014  Clinical Social Worker:  Carrington Clamp, [place]  Date/time:  02/28/2014 05:43 PM  Clinical Social Work is seeking post-discharge placement for this patient at the following level of care:   [place]   (*CSW will update this form in Epic as items are completed)   02/28/2014  Patient/family provided with [facility] [place] [facility] Department of Clinical Social Work's list of facilities offering this level of care within the geographic area requested by the patient (or if unable, by the patient's family).  02/28/2014  Patient/family informed of their freedom to choose among providers that offer the needed level of care, that participate in Medicare, Medicaid or managed care program needed by the patient, have an available bed and are willing to accept the patient.  02/28/2014  Patient/family informed of [facility]' ownership interest in [facility], as well as of the fact that they are under no obligation to receive care at this facility.  PASARR submitted to [employer] on 02/28/2014 PASARR number received from [employer] on 02/28/2014  FL2 transmitted to all facilities in geographic area requested by pt/family on  02/28/2014 FL2 transmitted to all facilities within larger geographic area on 02/28/2014  Patient informed that his/her managed care company has contracts with or will negotiate with  certain facilities, including the following:     Patient/family informed of bed offers received:  03/02/2014 Patient chooses bed at OTHER Physician recommends and patient chooses bed at    Patient to be transferred to [place] on  03/03/2014 Patient to be transferred to facility by EMS  The following physician request were entered in Epic:   Additional Comments: Patient going to [facility] in [place], [place], [place],  [place]

## 2014-03-03 NOTE — Progress Notes (Signed)
CSW spoke to [place] at [place] about ambulance transfer to [place], [name]. Tammy states because it is over 50 mile radius, patient would have to pay out of pocket. CSW discussed the amount and details with patient's cousin, Kristine Royal, who states she is able to pay that and will call [facility] right now to set up payment options. CSW explained this with patient and is setting up transfer to [facility] in [place], [name] for [place]. [place] signing off at this time  Jeanette Caprice, MSW, [place]

## 2014-03-06 NOTE — Progress Notes (Signed)
I have seen and examined the patient. I agree with the findings above.  Kenyanna Grzesiak H, MD  

## 2015-05-11 IMAGING — CT CT CTA ABD/PEL W/CM AND/OR W/O CM
2 of 8 series · 15 of 46 positions shown, 17 images · IV contrast (omnipaque)
Comparison: None.

CLINICAL DATA: Known abdominal aortic an

EXAM:
CT ANGIOGRAPHY ABDOMEN AND PELVIS WITH CONTRAST AND WITHOUT CONTRAST
TECHNIQUE: Multidetector CT imaging of the abdomen and pelvis was performed
using the standard protocol during bolus administration of
intravenous contrast. Multiplanar reconstructed images and MIPs were
obtained and reviewed to evaluate the vascular anatomy.
CONTRAST:  100mL OMNIPAQUE IOHEXOL 350 MG/ML SOLN

[Series 8: soft tissue · axial · 0.68mm/px · z∈[+88,+464]mm · 12 of 206 slices shown, 14 images]
[im 9/206  soft-tissue]
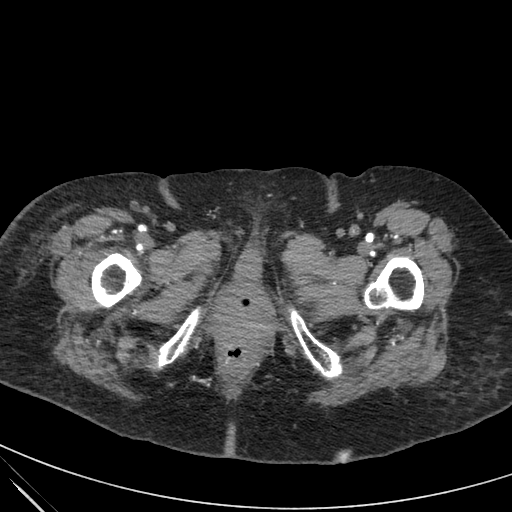
[im 9/206  bone]
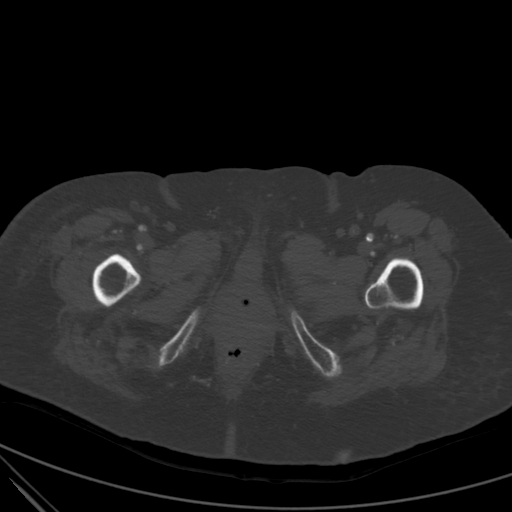
[im 26/206  soft-tissue]
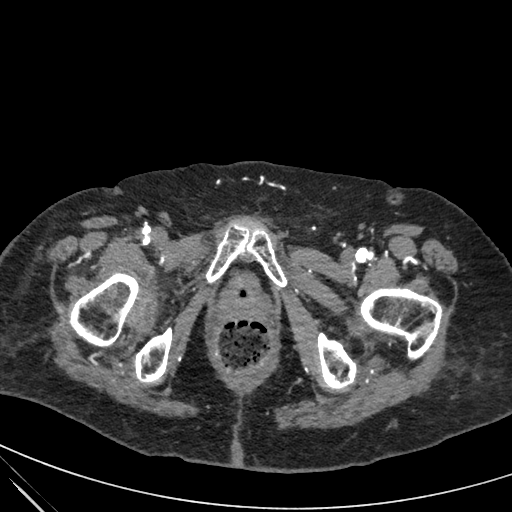
[im 43/206  soft-tissue]
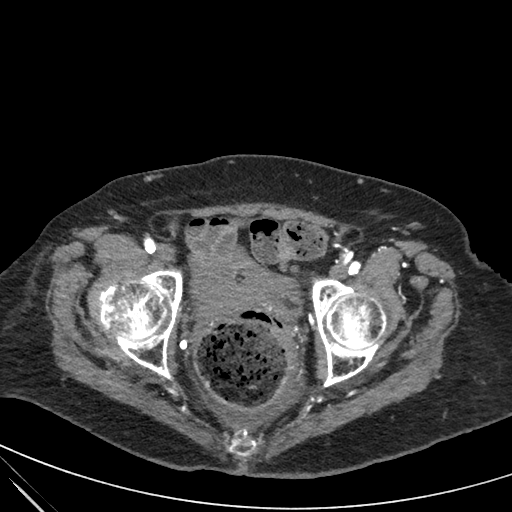
[im 60/206  soft-tissue]
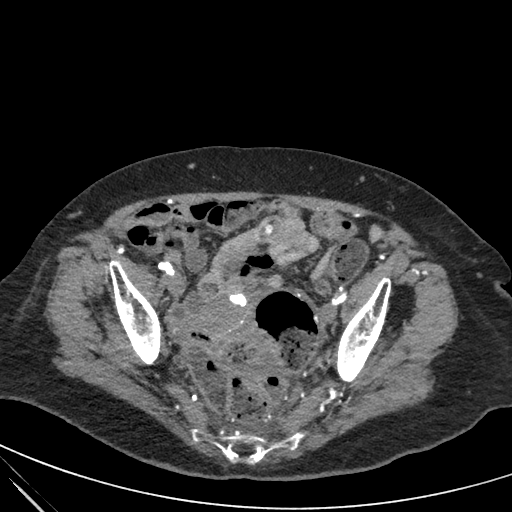
[im 77/206  soft-tissue]
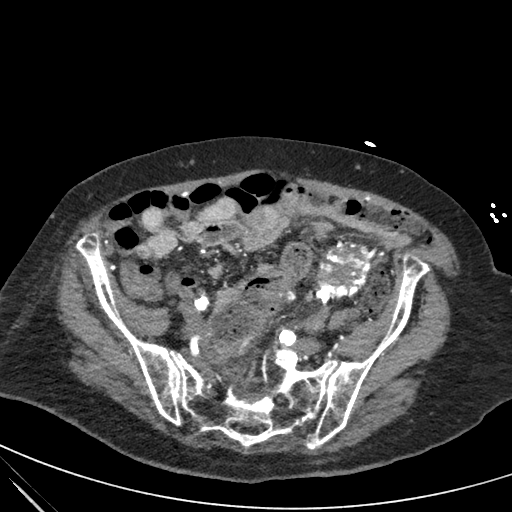
[im 94/206  soft-tissue]
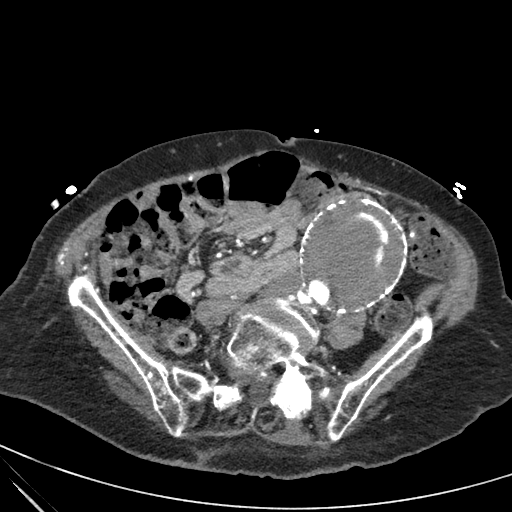
[im 112/206  soft-tissue]
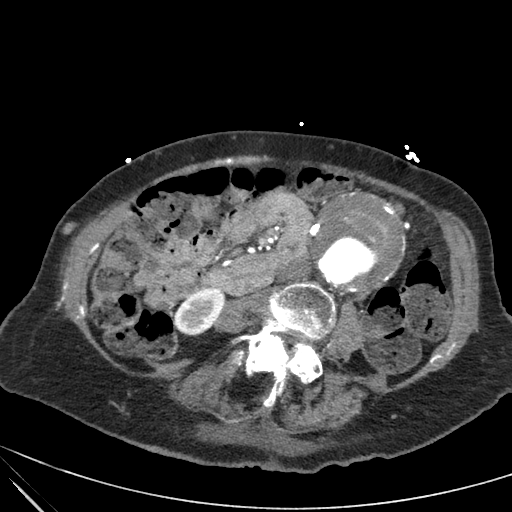
[im 129/206  soft-tissue]
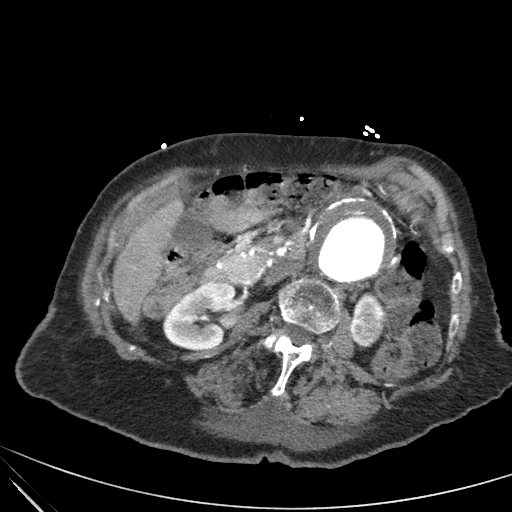
[im 146/206  soft-tissue]
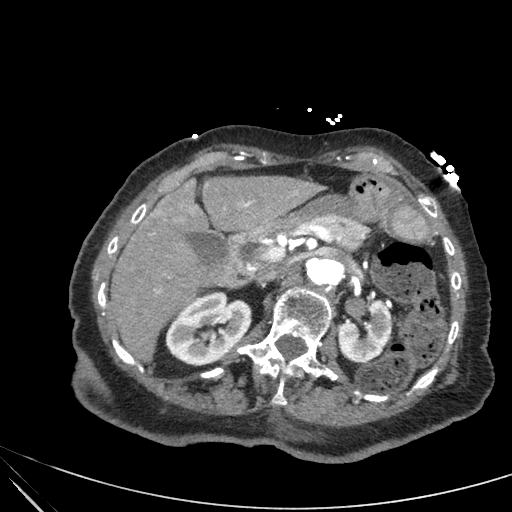
[im 146/206  bone]
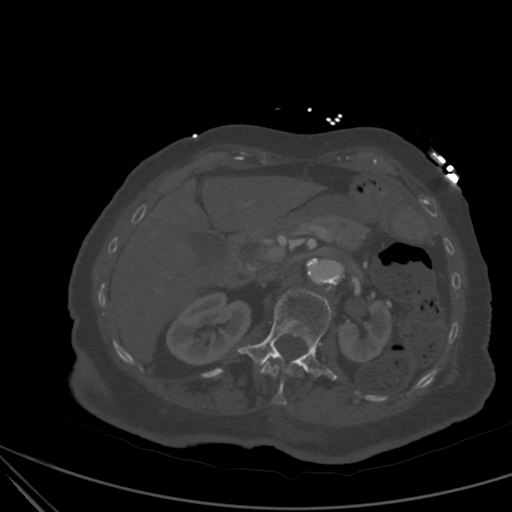
[im 163/206  soft-tissue]
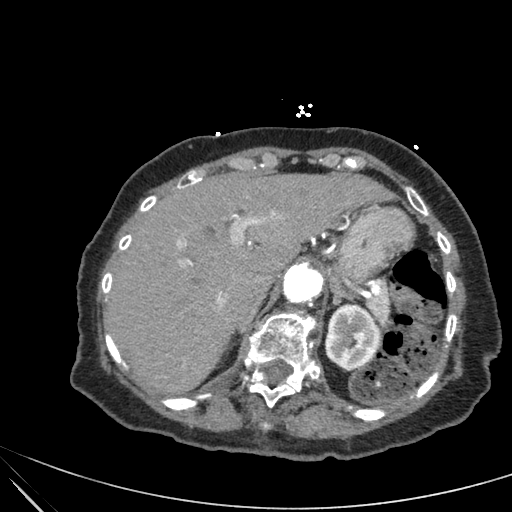
[im 180/206  soft-tissue]
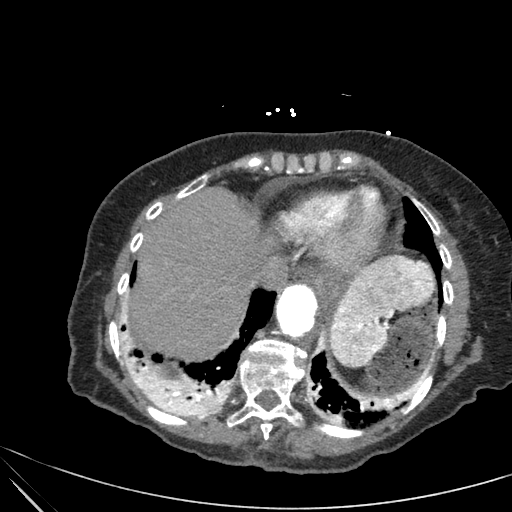
[im 197/206  soft-tissue]
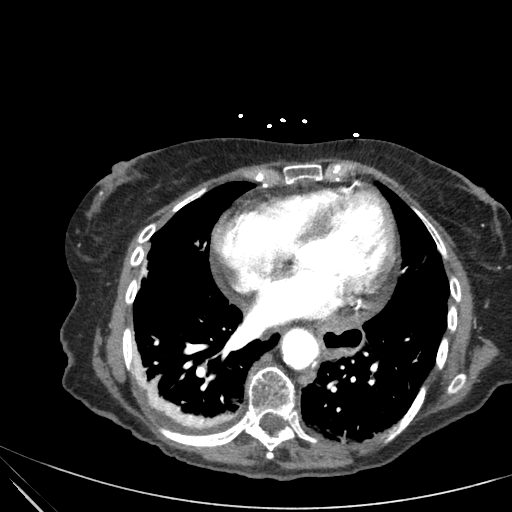

[mpr, coronal, coronal · coronal · 0.80mm/px · 3 of 91 slices shown]
[im 23/91  soft-tissue]
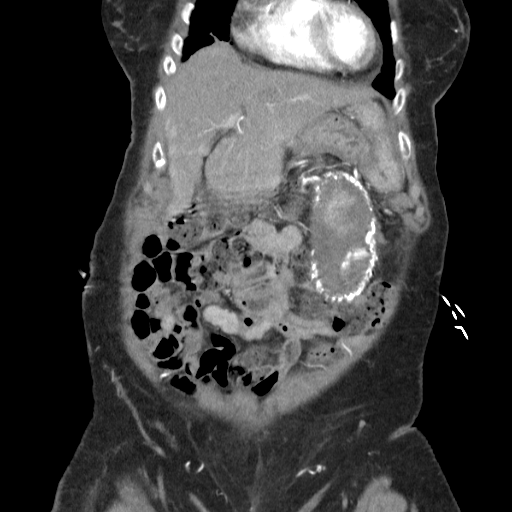
[im 46/91  soft-tissue]
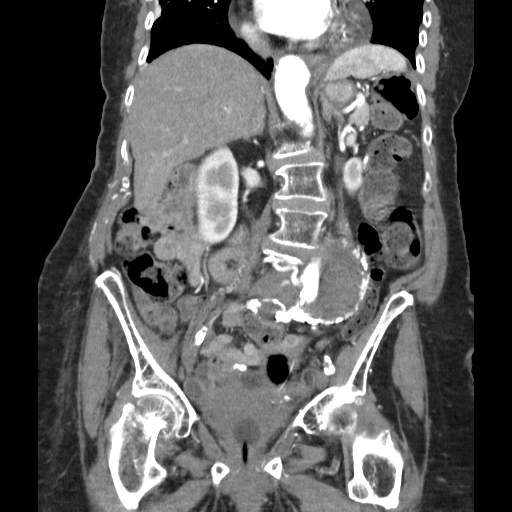
[im 68/91  soft-tissue]
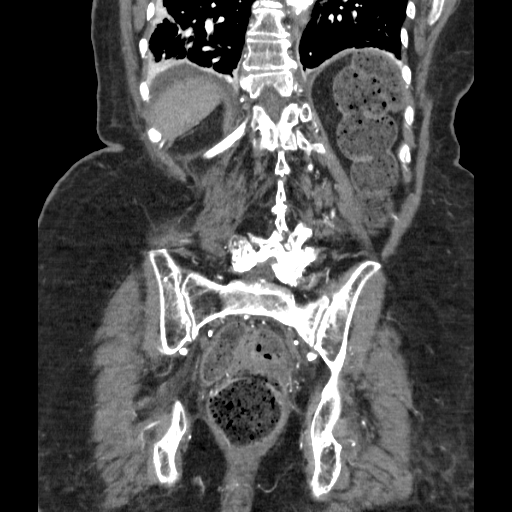

[15 of 46 positions shown; findings below may reference images not displayed]

FINDINGS: An infrarenal abdominal aortic aneurysm is present. Maximal oblique
measures of the aneurysm sac at table position 261.8 R 8.0 and
cm. There is a large amount of chronic appearing mural thrombus
within the aneurysm sac and a rather narrow channel of flow through
the aneurysm. There is calcified material within the aneurysm sac.
There is some higher density within the chronic thrombus which may
represent more recent chronic thrombus.

There is severe narrowing in the proximal celiac axis extending to
cm. Branch vessels are patent.

SMA is patent. Pancreatic branches are hypertrophied for collateral
flow to the celiac territory. Branch vessels are patent.

IMA origin is occluded.  Branch vessels reconstitute.

Single renal arteries are patent bilaterally.

The right common iliac artery is occluded. Right external iliac
artery is heavily calcified. It is severely diseased and diminutive.
Right internal iliac artery branches reconstitute.

Left common iliac artery is heavily calcified and grossly patent.
Left external iliac artery is heavily calcified and severely
diseased. Left internal iliac artery branches reconstitute and are
heavily calcified.

There is heavy calcified plaque in the right common femoral artery
which is also diminutive. The proximal superficial femoral and
profunda femoral arteries on the right are moderately diseased.

Left common femoral artery is heavily calcified. Significant
narrowing is suspected distally. Moderate disease of the left
superficial femoral and profunda femoral arteries in the proximal
thigh.

Lumbar artery is are hypertrophied for collateral flow. Superficial
muscular arteries in bilateral inguinal regions are also
hypertrophied for collateral flow.

Bibasilar atelectasis versus airspace disease. Small bilateral
pleural effusions.

Small pericardial effusion. Three vessel coronary artery
calcifications. Mild cardiomegaly.

Diffuse calcified and some irregular plaque of the visualized lower
thoracic aorta. There is a penetrating atherosclerotic ulcer at the
diaphragmatic hiatus at table position 425.8. No definite
dissection.

Liver enhancement is heterogeneous likely due to phase of contrast.
No obvious lesion.

Gallbladder, pancreas, and right kidney are within normal limits

Adrenal glands are somewhat prominent in a mild hyperplasia pattern.

There is scarring and atrophy of the left kidney. Small
hypodensities cannot be characterized.

The spleen is irregular but without focal mass or injury.

Extensive stool burden throughout the length of the colon.
Nonspecific mixed density thickening of the presacral space is
noted. Fecal impaction in the rectum is associated. No obvious bony
destruction. An inflammatory process of the rectum is not excluded.

Foley catheter decompresses the bladder.

Pelvic calcification on image 148 of series [DATE] represent a small
calcified fibroid.

Mild L4 wedge compression deformity with 20% loss of height
anteriorly. Age is indeterminate. Advanced degenerative changes in
the lumbar spine with scoliosis.

Review of the MIP images confirms the above findings.
IMPRESSION: Large abdominal aortic aneurysm with a maximal diameter of 8.0 cm.

Severe narrowing in the origin of the celiac axis. SMA is patent.
IMA origin is occluded.

Right common iliac artery occlusion. Left common iliac artery is
patent. There is severe disease in both external iliac arteries.

Extensive stool burden throughout the colon with fecal impaction in
the rectum. Mixed density in the presacral space likely represents
an inflammatory process associated with fecal impaction.

L4 compression deformity as described.

## 2016-09-03 DEATH — deceased
